# Patient Record
Sex: Female | Born: 1965 | Race: White | Hispanic: No | Marital: Married | Smoking: Former smoker
Health system: Southern US, Community
[De-identification: ages and names within clinical notes are randomized; demographics above are authoritative.]

## PROBLEM LIST (undated history)

## (undated) DIAGNOSIS — G459 Transient cerebral ischemic attack, unspecified: Secondary | ICD-10-CM

## (undated) DIAGNOSIS — M545 Low back pain, unspecified: Secondary | ICD-10-CM

## (undated) DIAGNOSIS — E559 Vitamin D deficiency, unspecified: Secondary | ICD-10-CM

## (undated) DIAGNOSIS — Z8489 Family history of other specified conditions: Secondary | ICD-10-CM

## (undated) DIAGNOSIS — E785 Hyperlipidemia, unspecified: Secondary | ICD-10-CM

## (undated) DIAGNOSIS — E7212 Methylenetetrahydrofolate reductase deficiency: Secondary | ICD-10-CM

## (undated) DIAGNOSIS — A6920 Lyme disease, unspecified: Secondary | ICD-10-CM

## (undated) DIAGNOSIS — E7211 Homocystinuria: Secondary | ICD-10-CM

## (undated) DIAGNOSIS — R601 Generalized edema: Secondary | ICD-10-CM

## (undated) DIAGNOSIS — R760 Raised antibody titer: Secondary | ICD-10-CM

## (undated) DIAGNOSIS — E282 Polycystic ovarian syndrome: Secondary | ICD-10-CM

## (undated) DIAGNOSIS — E039 Hypothyroidism, unspecified: Secondary | ICD-10-CM

## (undated) DIAGNOSIS — M1990 Unspecified osteoarthritis, unspecified site: Secondary | ICD-10-CM

## (undated) HISTORY — DX: Low back pain: M54.5

## (undated) HISTORY — DX: Low back pain, unspecified: M54.50

## (undated) HISTORY — DX: Polycystic ovarian syndrome: E28.2

## (undated) HISTORY — DX: Generalized edema: R60.1

## (undated) HISTORY — DX: Lyme disease, unspecified: A69.20

## (undated) HISTORY — DX: Hypothyroidism, unspecified: E03.9

## (undated) HISTORY — DX: Transient cerebral ischemic attack, unspecified: G45.9

## (undated) HISTORY — DX: Hyperlipidemia, unspecified: E78.5

## (undated) HISTORY — DX: Unspecified osteoarthritis, unspecified site: M19.90

## (undated) HISTORY — DX: Homocystinuria: E72.11

## (undated) HISTORY — DX: Vitamin D deficiency, unspecified: E55.9

## (undated) HISTORY — DX: Methylenetetrahydrofolate reductase deficiency: E72.12

## (undated) HISTORY — DX: Raised antibody titer: R76.0

## (undated) HISTORY — PX: OTHER SURGICAL HISTORY: SHX169

---

## 1999-10-13 ENCOUNTER — Other Ambulatory Visit: Admission: RE | Admit: 1999-10-13 | Discharge: 1999-10-13 | Payer: Self-pay | Admitting: *Deleted

## 2000-04-15 ENCOUNTER — Inpatient Hospital Stay (HOSPITAL_COMMUNITY): Admission: AD | Admit: 2000-04-15 | Discharge: 2000-04-15 | Payer: Self-pay | Admitting: Obstetrics and Gynecology

## 2000-06-19 ENCOUNTER — Inpatient Hospital Stay (HOSPITAL_COMMUNITY): Admission: AD | Admit: 2000-06-19 | Discharge: 2000-06-23 | Payer: Self-pay | Admitting: *Deleted

## 2000-08-21 ENCOUNTER — Other Ambulatory Visit: Admission: RE | Admit: 2000-08-21 | Discharge: 2000-08-21 | Payer: Self-pay | Admitting: Obstetrics & Gynecology

## 2001-08-22 ENCOUNTER — Other Ambulatory Visit: Admission: RE | Admit: 2001-08-22 | Discharge: 2001-08-22 | Payer: Self-pay | Admitting: Obstetrics & Gynecology

## 2002-10-03 ENCOUNTER — Other Ambulatory Visit: Admission: RE | Admit: 2002-10-03 | Discharge: 2002-10-03 | Payer: Self-pay | Admitting: Obstetrics and Gynecology

## 2004-05-05 ENCOUNTER — Other Ambulatory Visit: Admission: RE | Admit: 2004-05-05 | Discharge: 2004-05-05 | Payer: Self-pay | Admitting: Obstetrics & Gynecology

## 2005-12-11 ENCOUNTER — Encounter: Admission: RE | Admit: 2005-12-11 | Discharge: 2005-12-11 | Payer: Self-pay | Admitting: Orthopedic Surgery

## 2006-01-01 ENCOUNTER — Encounter: Admission: RE | Admit: 2006-01-01 | Discharge: 2006-01-01 | Payer: Self-pay | Admitting: Orthopedic Surgery

## 2006-04-12 ENCOUNTER — Encounter: Admission: RE | Admit: 2006-04-12 | Discharge: 2006-04-12 | Payer: Self-pay | Admitting: Obstetrics and Gynecology

## 2006-08-23 ENCOUNTER — Encounter: Admission: RE | Admit: 2006-08-23 | Discharge: 2006-08-23 | Payer: Self-pay | Admitting: Orthopedic Surgery

## 2006-09-12 ENCOUNTER — Encounter: Admission: RE | Admit: 2006-09-12 | Discharge: 2006-09-12 | Payer: Self-pay | Admitting: Orthopedic Surgery

## 2007-02-08 ENCOUNTER — Encounter: Admission: RE | Admit: 2007-02-08 | Discharge: 2007-02-08 | Payer: Self-pay | Admitting: Orthopedic Surgery

## 2007-11-15 ENCOUNTER — Encounter: Admission: RE | Admit: 2007-11-15 | Discharge: 2007-11-15 | Payer: Self-pay | Admitting: Obstetrics and Gynecology

## 2008-08-04 ENCOUNTER — Encounter: Admission: RE | Admit: 2008-08-04 | Discharge: 2008-08-04 | Payer: Self-pay | Admitting: Obstetrics & Gynecology

## 2008-09-15 ENCOUNTER — Encounter: Admission: RE | Admit: 2008-09-15 | Discharge: 2008-09-15 | Payer: Self-pay | Admitting: Obstetrics & Gynecology

## 2009-12-09 ENCOUNTER — Encounter: Admission: RE | Admit: 2009-12-09 | Discharge: 2009-12-09 | Payer: Self-pay | Admitting: Obstetrics & Gynecology

## 2010-01-13 ENCOUNTER — Encounter
Admission: RE | Admit: 2010-01-13 | Discharge: 2010-01-13 | Payer: Self-pay | Source: Home / Self Care | Admitting: Obstetrics & Gynecology

## 2010-05-08 DIAGNOSIS — G459 Transient cerebral ischemic attack, unspecified: Secondary | ICD-10-CM

## 2010-05-08 HISTORY — DX: Transient cerebral ischemic attack, unspecified: G45.9

## 2010-06-05 ENCOUNTER — Emergency Department (HOSPITAL_COMMUNITY)
Admission: EM | Admit: 2010-06-05 | Discharge: 2010-06-06 | Disposition: A | Discharge status: Home / Self Care | Payer: 59 | Attending: Emergency Medicine | Admitting: Emergency Medicine

## 2010-06-05 DIAGNOSIS — E039 Hypothyroidism, unspecified: Secondary | ICD-10-CM | POA: Insufficient documentation

## 2010-06-05 DIAGNOSIS — R319 Hematuria, unspecified: Secondary | ICD-10-CM | POA: Insufficient documentation

## 2010-06-05 DIAGNOSIS — R109 Unspecified abdominal pain: Secondary | ICD-10-CM | POA: Insufficient documentation

## 2010-06-05 LAB — URINALYSIS, ROUTINE W REFLEX MICROSCOPIC
Ketones, ur: NEGATIVE mg/dL
Protein, ur: NEGATIVE mg/dL
Urobilinogen, UA: 0.2 mg/dL (ref 0.0–1.0)

## 2010-06-05 LAB — CBC
HCT: 42.5 % (ref 36.0–46.0)
MCH: 30.3 pg (ref 26.0–34.0)
MCV: 89.3 fL (ref 78.0–100.0)
Platelets: 328 10*3/uL (ref 150–400)
RBC: 4.76 MIL/uL (ref 3.87–5.11)

## 2010-06-05 LAB — DIFFERENTIAL
Eosinophils Absolute: 0.4 10*3/uL (ref 0.0–0.7)
Eosinophils Relative: 3 % (ref 0–5)
Lymphocytes Relative: 11 % — ABNORMAL LOW (ref 12–46)
Lymphs Abs: 1.5 10*3/uL (ref 0.7–4.0)
Monocytes Relative: 8 % (ref 3–12)
Neutrophils Relative %: 78 % — ABNORMAL HIGH (ref 43–77)

## 2010-06-05 LAB — URINE MICROSCOPIC-ADD ON

## 2010-06-06 LAB — BASIC METABOLIC PANEL
BUN: 12 mg/dL (ref 6–23)
Chloride: 107 mEq/L (ref 96–112)
Creatinine, Ser: 0.69 mg/dL (ref 0.4–1.2)
Glucose, Bld: 100 mg/dL — ABNORMAL HIGH (ref 70–99)
Potassium: 3.9 mEq/L (ref 3.5–5.1)

## 2010-06-06 LAB — CK: Total CK: 88 U/L (ref 7–177)

## 2010-06-16 ENCOUNTER — Other Ambulatory Visit: Payer: Self-pay | Admitting: Nurse Practitioner

## 2010-06-16 DIAGNOSIS — R52 Pain, unspecified: Secondary | ICD-10-CM

## 2010-06-16 DIAGNOSIS — D6861 Antiphospholipid syndrome: Secondary | ICD-10-CM

## 2010-06-17 ENCOUNTER — Ambulatory Visit
Admission: RE | Admit: 2010-06-17 | Discharge: 2010-06-17 | Disposition: A | Discharge status: Home / Self Care | Payer: 59 | Source: Ambulatory Visit | Attending: Nurse Practitioner | Admitting: Nurse Practitioner

## 2010-06-17 DIAGNOSIS — R52 Pain, unspecified: Secondary | ICD-10-CM

## 2010-06-17 DIAGNOSIS — D6861 Antiphospholipid syndrome: Secondary | ICD-10-CM

## 2010-06-24 NOTE — Discharge Summary (Signed)
[facility]  Patient:    Sherri Mullins, Sherri Mullins                         MRN: [id number] Adm. Date:  [id number] Disc. Date: [id number] Attending:  Donne Hazel Dictator:   Danie Chandler, R.N.                           Discharge Summary  ADMISSION DIAGNOSIS:          Intrauterine pregnancy for post dates induction.  DISCHARGE DIAGNOSES:          1. Intrauterine pregnancy for post dates                                  induction.                               2. Cephalopelvic disproportion with failed                                  attempt to deliver infant vaginally by                                  vacuum extractor.  PROCEDURE:                    Jun 20, 2000, primary low transverse cervical cesarean section.  REASON FOR ADMISSION:         The patient was admitted for induction on the evening prior to the surgery and was induced with Cytotec intravaginally.  The patient progressed through the night to 8 cm of dilatation by approximately 9:30 a.m.  The later part of the second stage was a little slow with progression to complete by approximately 12 noon.  The patient then pushed for about an hour and a half without significant descent of the head. The head was found to be a left occiput transverse position. Vacuum extractor was applied with good application and attempted delivery with a total of six contractions. Descent could be achieved to a level of approximately +3 to +4 but the head would not rotate and it was felt that the mid pelvis was compromised and total that the size of the infant was large and for that reason, attempts to deliver vaginally were abandoned.  HOSPITAL COURSE:              The patient was taken to the operating room and underwent the above named procedure without complications.  This was productive of a viable female infant with Apgars of 8 at one minute and 9 at five minutes. Postoperatively on day #1, the patients  hemoglobin was 11.2, hematocrit 32.7 and white blood cell count 16.8.  The patient had good control of pain on this day and on postoperative day #2, she had a good return of bowel function and was tolerating a regular diet. She was also ambulating well without difficulty. She was discharged home on postoperative day #3.  CONDITION ON DISCHARGE:       Good.  DIET:  Regular as tolerated.  ACTIVITY:                     No heavy lifting, no driving, no vaginal entry.  FOLLOW-UP:                    She is to follow up in the office in one to two weeks for an incision check and she is to call for temperature greater than 100 degrees, persistent nausea or vomiting, heavy vaginal bleeding, and/or redness or drainage from the incision site.  DISCHARGE MEDICATIONS:        1. Prenatal vitamin one p.o. q.d.                               2. Demerol 50 mg #40 as directed by M.D. D:  07/05/00 TD:  07/05/00 Job: [id number]

## 2010-06-24 NOTE — Op Note (Signed)
[facility]  Patient:    Sherri Mullins, Sherri Mullins                         MRN: [id number] Proc. Date: 06/20/00 Adm. Date:  [id number] Attending:  Donne Hazel                           Operative Report  PREOPERATIVE DIAGNOSES:       Intrauterine pregnancy, at [redacted] weeks gestation. Cephalopelvic disproportion with failed attempt to deliver infant vaginally by vacuum extractor.  POSTOPERATIVE DIAGNOSES:      Intrauterine pregnancy, at [redacted] weeks gestation. Cephalopelvic disproportion with failed attempt to deliver infant vaginally by vacuum extractor.  OPERATIVE PROCEDURE:         Primary low transverse cervical cesarean section.  SURGEON:                      Minette Headland, M.D.  ANESTHESIA:                   Epidural.  INTRAOPERATIVE COMPLICATIONS:                None.  ESTIMATED INTRAOPERATIVE BLOOD LOSS:                   600-800 cc.  INDICATIONS:                  The patient was admitted for induction on the evening prior to the surgery and was induced with Cytotec intravaginally.  She progressed through the night to 8 cm of dilatation by approximately 9:30 a.m. The later part of second stage was a little slow with progression to complete by approximately 12 noon.  She then pushed for an hour and a half without significant decent of the head.  The head was found to be in the left occiput transverse position.  Vacuum extractor was applied with good application and attempted delivery with a total of six contractions.  Descent could be achieved to a level of approximately +3 to +4 but the head would not rotate and it was felt that the mid pelvis was compromised and that the size of the infant was large, and for that reason, attempts to deliver vaginally were abandoned.  DESCRIPTION OF PROCEDURE:     The patient was then brought to the operating room.  Her epidural was re-dosed for surgery.  She did have a hot spot and required local to the  skin for the skin incision.  A lower abdominal transverse incision was made and carried sharply down to fascia which was entered sharply and extended to the extent of skin incision.  The rectus sheath was developed superiorly and inferiorly with blunt and sharp dissection.  Rectus muscles were divided in the midline. The peritoneum was entered sharply, and extended sharply to the extent of the skin incision.  A bladder blade was placed.  A transverse incision was made.  The visceral peritoneum overlying the lower uterine segment and the bladder bluntly dissected off the lower segment.  A transverse incision was made in the lower segment.  The infants head was elevated out of pelvis.  The infant was then delivered without significant difficulty.  It was a viable female with Apgars of 8 and 9 as noted above.  Arterial cord blood was obtained for gases and is pending.  The placenta and other products of  conception were removed from the uterus after collection of routine venous cord sample.  Uterine incision was closed with a running locking suture of 0 Monocryl.  The bladder flap was reapproximated with an interrupted figure-of-eight of 0 Monocryl. Irrigation was carried out.  Hemostasis was adequate.  Abdominal incision was then closed in layers, running 0 Monocryl was used to close the peritoneum and reapproximated the rectus muscles.  The fascia was closed with running 0 Panacryl.  The skin was closed with wide skin staples and 0.25 Steri-Strips. The patient tolerated the procedure well and was taken to the recovery room in good condition. DD:  06/20/00 TD:  06/20/00 Job: [id number]

## 2011-10-26 ENCOUNTER — Telehealth: Payer: Self-pay | Admitting: Oncology

## 2011-10-26 NOTE — Telephone Encounter (Signed)
S/W in re NP appt 10/02 @ 10:30 w/ Dr. Gaylyn Rong. Referring Dr. 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 NP packet mailed

## 2011-10-27 ENCOUNTER — Encounter: Payer: Self-pay | Admitting: Oncology

## 2011-10-27 ENCOUNTER — Telehealth: Payer: Self-pay | Admitting: Oncology

## 2011-10-27 DIAGNOSIS — R760 Raised antibody titer: Secondary | ICD-10-CM | POA: Insufficient documentation

## 2011-10-27 NOTE — Telephone Encounter (Signed)
C/D on 9/20 for appt 10/02

## 2011-10-31 ENCOUNTER — Other Ambulatory Visit: Payer: Self-pay | Admitting: Obstetrics & Gynecology

## 2011-10-31 DIAGNOSIS — N63 Unspecified lump in unspecified breast: Secondary | ICD-10-CM

## 2011-11-08 ENCOUNTER — Other Ambulatory Visit (HOSPITAL_BASED_OUTPATIENT_CLINIC_OR_DEPARTMENT_OTHER): Payer: BC Managed Care – PPO | Admitting: Lab

## 2011-11-08 ENCOUNTER — Ambulatory Visit: Payer: BC Managed Care – PPO

## 2011-11-08 ENCOUNTER — Encounter: Payer: Self-pay | Admitting: Oncology

## 2011-11-08 ENCOUNTER — Ambulatory Visit (HOSPITAL_BASED_OUTPATIENT_CLINIC_OR_DEPARTMENT_OTHER): Payer: BC Managed Care – PPO | Admitting: Oncology

## 2011-11-08 VITALS — BP 131/81 | HR 90 | Temp 97.4°F | Resp 20 | Ht 64.0 in | Wt 211.3 lb

## 2011-11-08 DIAGNOSIS — R0609 Other forms of dyspnea: Secondary | ICD-10-CM

## 2011-11-08 DIAGNOSIS — R894 Abnormal immunological findings in specimens from other organs, systems and tissues: Secondary | ICD-10-CM

## 2011-11-08 DIAGNOSIS — R760 Raised antibody titer: Secondary | ICD-10-CM

## 2011-11-08 DIAGNOSIS — R0989 Other specified symptoms and signs involving the circulatory and respiratory systems: Secondary | ICD-10-CM

## 2011-11-08 DIAGNOSIS — E7210 Disorders of sulfur-bearing amino-acid metabolism, unspecified: Secondary | ICD-10-CM

## 2011-11-08 NOTE — Progress Notes (Signed)
Checked in new pt with no financial concerns. °

## 2011-11-08 NOTE — Progress Notes (Signed)
[facility]  [phone] [phone]     INITIAL HEMATOLOGY CONSULTATION    Referral Provider:  Elie Goody, N.P.   Reason for Referral: positive phospholipid antibody.     HPI:  Sherri Mullins is a 46 year old woman.  She has been feeling tired the last few years.  She was told in the past that she had obesity.  She was not satisfied with this explanation.  She has been under medical care of Sherri Mullins, N.P.  A pan work up was sent which showed positive phospholipid antibody with antiphosphotidylserine elevated at 50.  She was tested negative for lupus.  Thus, she was kindly referred to the [facility] for evaluation.   Sherri Mullins presented to the Clinic today for the first time by herself.  She never had documented episodes of thrombosis.  She had an episode in 05/2010 when she was under lots of stress at work, and had problem with words findings.  She thought that she had a TIA event.  However, she never went to the hospital to have this checked out.  She also has been having intermittent exertional chest pressure, SOB, dyspnea on exertion episodes.  Since she has been with Elie Goody, she has been found to have hypothyroid and has been on Synthroid.  She was found to have MTHFR gene mutation and was prescribed a formulated folate.  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  She has been on Aldactone and Lasix not for hypertension but in patient's opinion was to "cleansed her system of toxins."     Since being on folate and Synthroid, her fatigue has slightly improved. She also has been having intermittent exertional chest pressure, SOB, dyspnea on exertion episodes. She never had known documented PE or MI.    Patient denies fever, anorexia, weight loss, headache, visual changes, confusion, drenching night sweats, palpable lymph node swelling, mucositis, odynophagia, dysphagia, nausea vomiting, jaundice, productive cough, gum  bleeding, epistaxis, hematemesis, hemoptysis, abdominal pain, abdominal swelling, early satiety, melena, hematochezia, hematuria, skin rash, spontaneous bleeding, joint swelling, heat or cold intolerance, bowel bladder incontinence, focal motor weakness, paresthesia.  As mentioned earlier, she does not have skin rash.  However, when she raises her arms, she noticed significant pallor color to her arms.  She denied any paresthesia, neck pain muscle weakness in her bilateral arms.      Past Medical History  Diagnosis Date  . Antiphospholipid antibody positive   . Hyperlipidemia   . TIA (transient ischemic attack) 05/2010    ?  work; pressure; aphasia.  No documented stroke.   Marland Kitchen Hypothyroid   . Generalized edema   . Vitamin d deficiency   . Lumbar back pain     with steroid injection  (in 2009  :    Past Surgical History  Procedure Date  . Cesarean section 2002  . Epidural steroid injection   :   CURRENT MEDS: Current Outpatient Prescriptions  Medication Sig Dispense Refill  . 5-Methyltetrahydrofolate Disod POWD by Does not apply route. Use as directed      . ARMOUR THYROID 120 MG tablet Take 120 mg by mouth Daily.      . Calcium Carb-Cholecalciferol (LIQUID CALCIUM WITH D3 PO) Take by mouth. Use as directed      . cholestyramine (QUESTRAN) 4 G packet As directed      . furosemide (LASIX) 20 MG tablet Take 20 mg by mouth Daily.      Marland Kitchen  ketoconazole (NIZORAL) 2 % shampoo As directed      . liothyronine (CYTOMEL) 5 MCG tablet Take 5 mcg by mouth Daily.      . naltrexone (DEPADE) 50 MG tablet Take 50 mg by mouth daily.      Marland Kitchen oxymetazoline (AFRIN) 0.05 % nasal spray Place 2 sprays into the nose 2 (two) times daily.      . potassium chloride (K-DUR,KLOR-CON) 10 MEQ tablet Take 10 mEq by mouth daily.      . progesterone (PROMETRIUM) 100 MG capsule Take by mouth daily. As directed      . spironolactone (ALDACTONE) 50 MG tablet Take 50 mg by mouth Daily.          Allergies    Allergen Reactions  . Nitrous Oxide   . Percocet (Oxycodone-Acetaminophen) Itching  . Percodan (Oxycodone-Aspirin) Itching  :  Family History  Problem Relation Age of Onset  . Cancer Maternal Aunt 60    breast   . Cancer Maternal Grandmother 60    breast  . Stroke Maternal Grandfather   :  History   Social History  . Marital Status: Married    Spouse Name: N/A    Number of Children: 1  . Years of Education: N/A   Occupational History  . 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 Smoking status: Former Smoker -- 1.0 packs/day for 12 years    Quit date: 02/06/1998  . Smokeless tobacco: Never Used  . Alcohol Use: No  . Drug Use: No  . Sexually Active:    Other Topics Concern  . Not on file   Social History Narrative  . No narrative on file  :  REVIEW OF SYSTEM:  The rest of the 14-point review of sytem was negative.   Exam: ECOG 0  General:  well-nourished woman, in no acute distress.  Eyes:  no scleral icterus.  ENT:  There were no oropharyngeal lesions.  Neck was without thyromegaly.  Lymphatics:  Negative cervical, supraclavicular or axillary adenopathy.  Respiratory: lungs were clear bilaterally without wheezing or crackles.  Cardiovascular:  Regular rate and rhythm, S1/S2, without murmur, rub or gallop. There was no pedal edema.  GI:  abdomen was soft, flat, nontender, nondistended, without organomegaly.  Muscoloskeletal:  no spinal tenderness of palpation of vertebral spine.  Skin exam was without echymosis, petichae.  Neuro exam was nonfocal.  Patient was able to get on and off exam table without assistance.  Gait was normal.  Patient was alerted and oriented.  Attention was good.   Language was appropriate.  Mood was normal without depression.  Speech was not pressured.  Thought content was not tangential.    LABS:  Lab Results  Component Value Date   WBC 13.6* 06/05/2010   HGB 14.4 06/05/2010   HCT 42.5 06/05/2010   PLT 328 06/05/2010    GLUCOSE 100* 06/05/2010   NA 140 06/05/2010   K 3.9 06/05/2010   CL 107 06/05/2010   CREATININE 0.69 06/05/2010   BUN 12 06/05/2010   CO2 27 06/05/2010     ASSESSMENT AND PLAN:   1.  Positive antiphospholipid antibody.  -Interpretation:  This test is very nonspecific; and high rate of false positive.  In other words, many patients have this antibody without having the syndrome (antilupus antibody syndrome or antiphospholipid antibody syndrome).  -  Recommendation:  Repeat this test again today for documentation that it is truly elevated.  Without documented blot clot, I do not recommend lifelong anticoagulation since the risk of bleeding outweighs the benefit of decreasing blood clot.  Her one episode of aphasia could be due to stress; and she never had documented CVA, TIA.  Her DOE symptoms could be more cardiac in origin.  She never had documented DVT, PE. - However, if patients with persistently positive antiphospholipid antibody and have a documented blood clot, then, recommendation would be life long blood thinner.   2.  MTHFR mutation:  The risk of thrombosis with this mutation is not significantly elevated like other thrombophilia such as factor V leyden, antithrombin III deficiency.  I defer to patient's PCP's expertise as to whether folate is indicated.  But from hematology standpoint, folate is not necessary.   3.  Dyspnea on exertion:  I strongly advised patient to discuss with her PCP to see whether a cardiac referral is indicated at least for noninvasive, exercise stress test to rule out cardiac cause of this symptom.  I have low clinical suspicion for pulmonary/hem causes of her DOE.   4.  Follow up:  As needed.  No return appointment at this time.    Thank you for this referral.    The length of time of the face-to-face encounter was 30 minutes. More than 50% of time was spent counseling and coordination of care.

## 2011-11-08 NOTE — Patient Instructions (Addendum)
1.  Issue:  Positive antiphospholipid antibody.  2.  Interpretation:  This test is very nonspecific; and high rate of false positive.  In other words, many patients have this antibody without having the syndrome (antilupus antibody syndrome or antiphospholipid antibody syndrome).  3.  Recommendation:  Repeat this test again for documentation that it is truly elevated.   Without documented blot clot, I do not recommend lifelong anticoagulation since the risk of bleeding outweighs the benefit of decreasing blood clot.  However, if patients with persistently positive antiphospholipid antibody and have a documented blood clot, then, recommendation would be life long blood thinner.  4.  Follow up:  As needed.  No return appointment at this time.

## 2011-11-09 ENCOUNTER — Ambulatory Visit
Admission: RE | Admit: 2011-11-09 | Discharge: 2011-11-09 | Disposition: A | Discharge status: Home / Self Care | Payer: BC Managed Care – PPO | Source: Ambulatory Visit | Attending: Obstetrics & Gynecology | Admitting: Obstetrics & Gynecology

## 2011-11-09 ENCOUNTER — Encounter: Payer: Self-pay | Admitting: Oncology

## 2011-11-09 ENCOUNTER — Other Ambulatory Visit: Payer: Self-pay | Admitting: Obstetrics & Gynecology

## 2011-11-09 DIAGNOSIS — N63 Unspecified lump in unspecified breast: Secondary | ICD-10-CM

## 2011-11-09 LAB — BETA-2 GLYCOPROTEIN ANTIBODIES: Beta-2 Glyco I IgG: 0 G Units (ref <20)

## 2011-11-09 LAB — LUPUS ANTICOAGULANT PANEL: DRVVT: 40.3 secs (ref <42.9)

## 2012-02-22 IMAGING — US US EXTREM LOW VENOUS BILAT
1 series · 14 of 24 positions shown · non-contrast
Comparison: None

CLINICAL DATA: Pain//Antiphospholipid syndrome//? DVT;;

BILATERAL LOWER EXTREMITY VENOUS DUPLEX ULTRASOUND
TECHNIQUE: Gray-scale sonography with compression, as well as color
and duplex ultrasound, were performed to evaluate the deep venous
system from the level of the common femoral vein through the
popliteal and proximal calf veins.

[Series 1: us extrem low venous bilat · 14 of 57 slices shown]
[im 1/57]
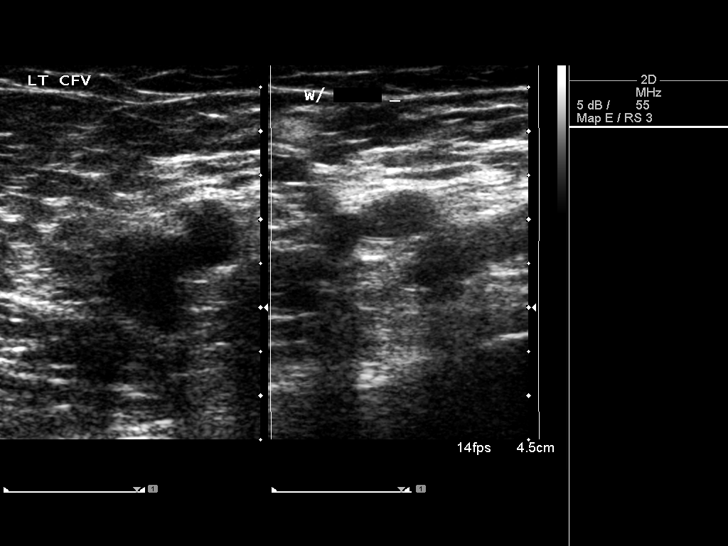
[im 5/57]
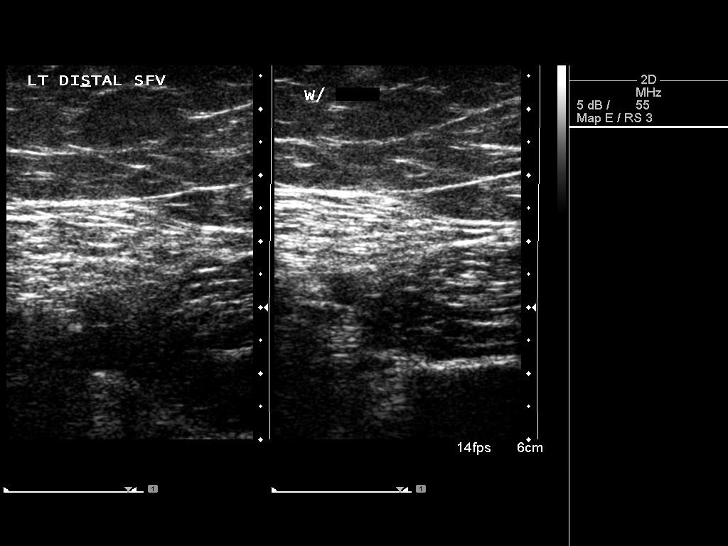
[im 10/57]
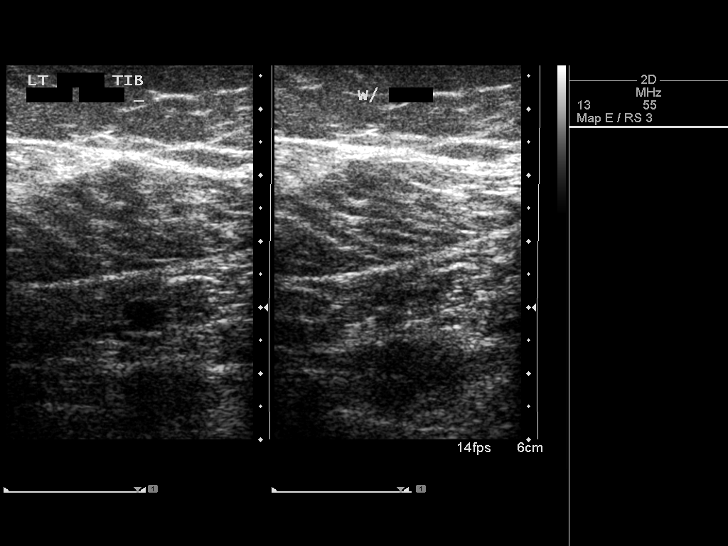
[im 15/57]
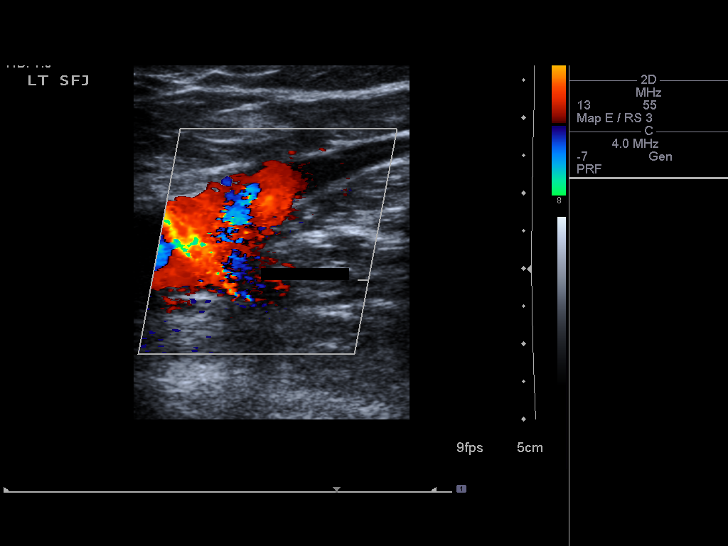
[im 18/57]
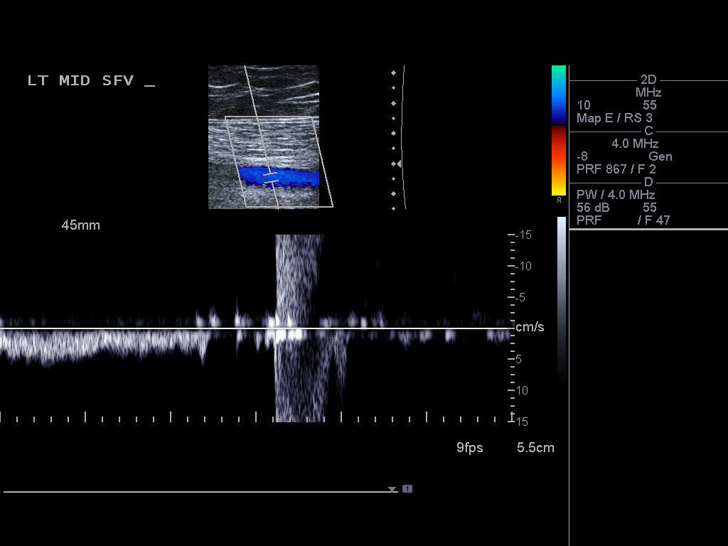
[im 22/57]
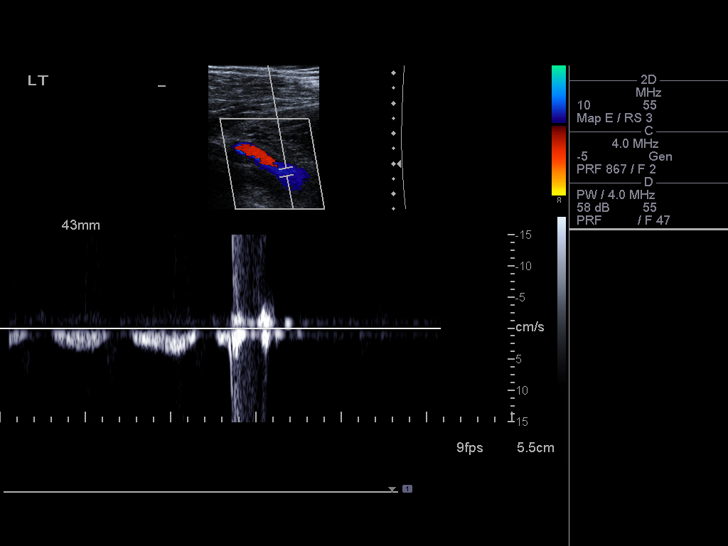
[im 27/57]
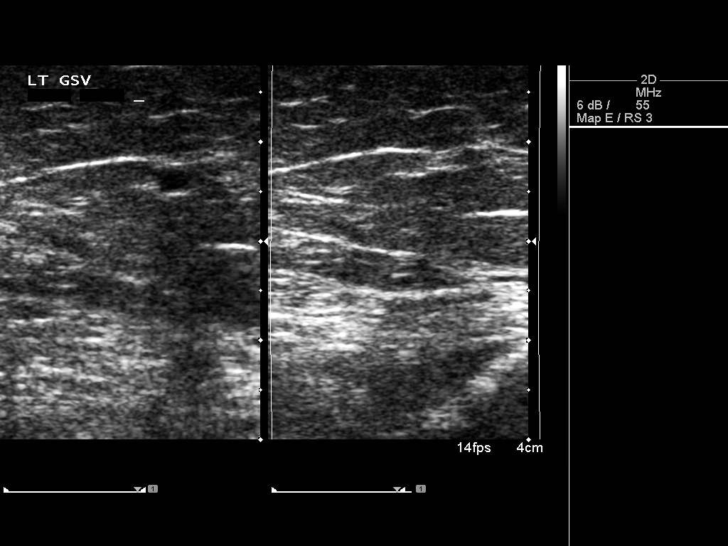
[im 30/57]
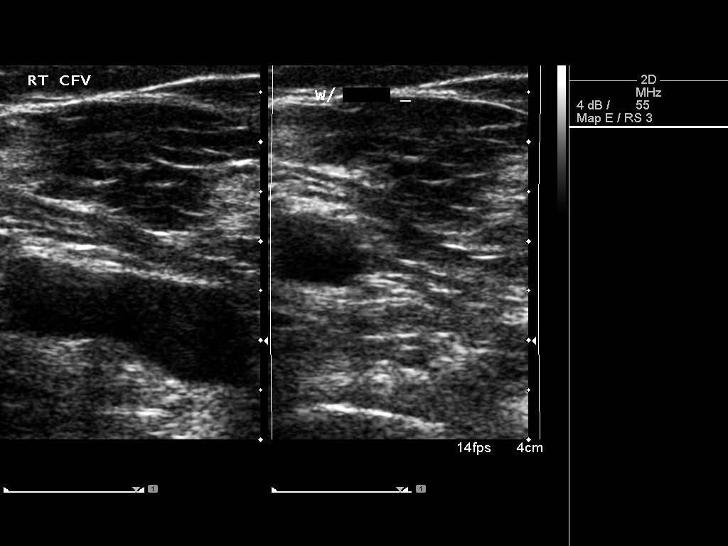
[im 35/57]
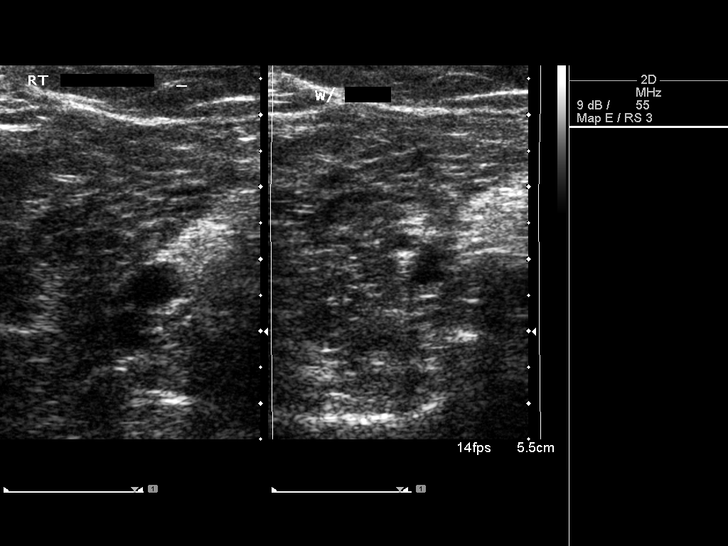
[im 39/57]
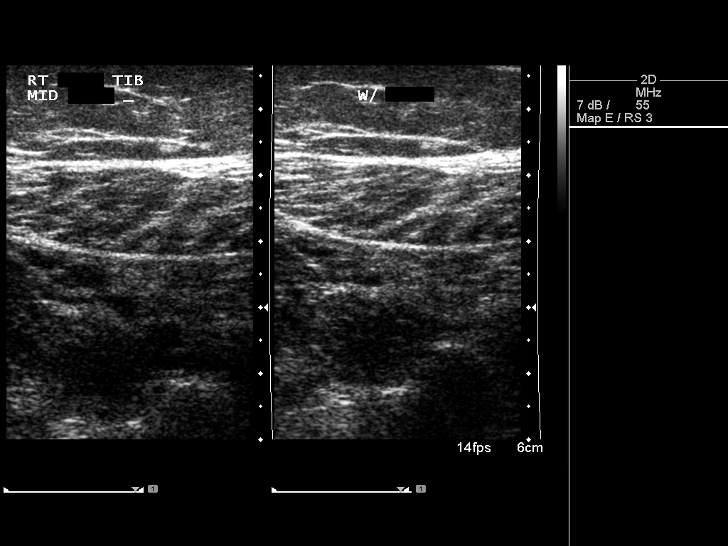
[im 44/57]
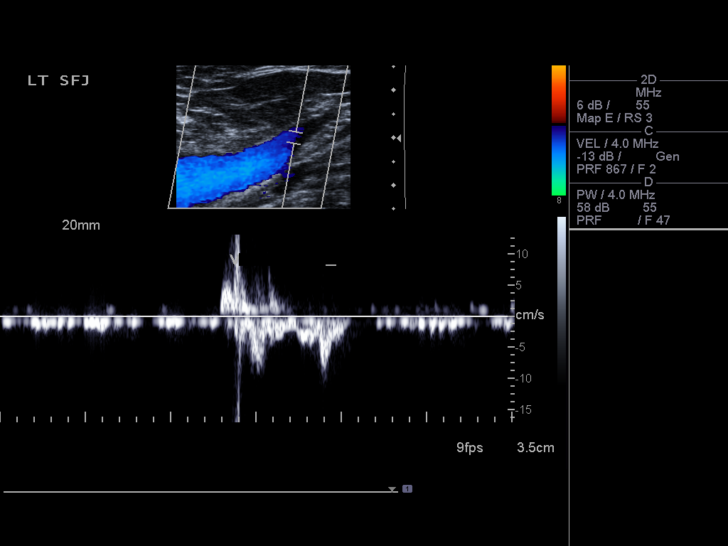
[im 47/57]
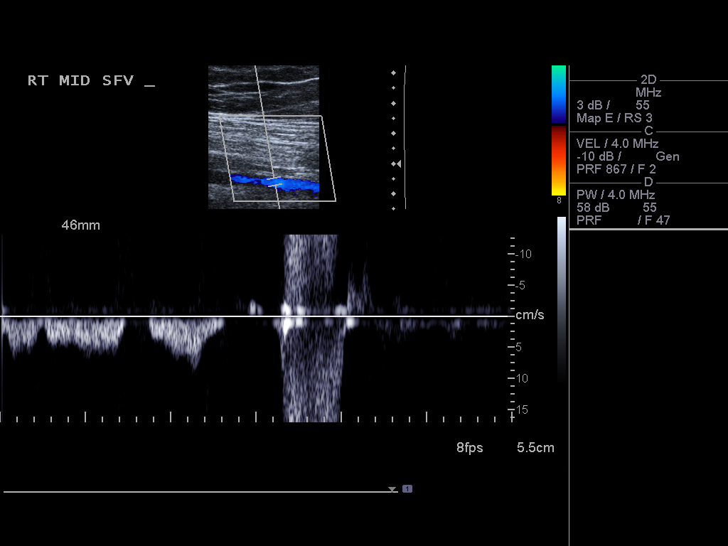
[im 52/57]
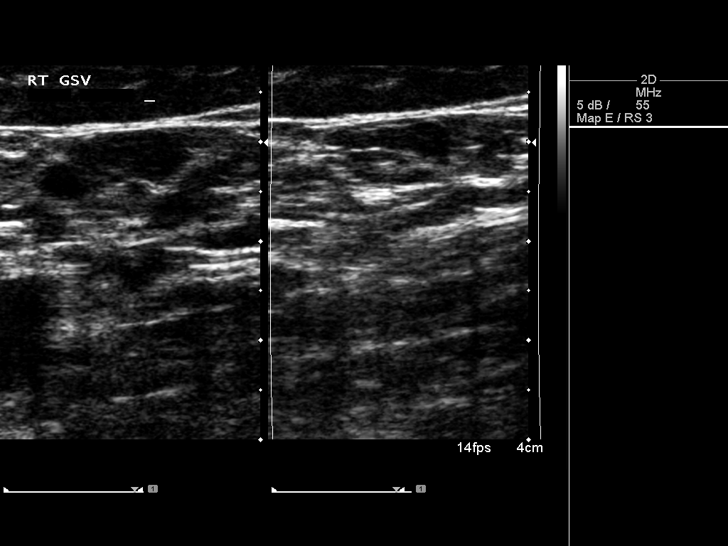
[im 57/57]
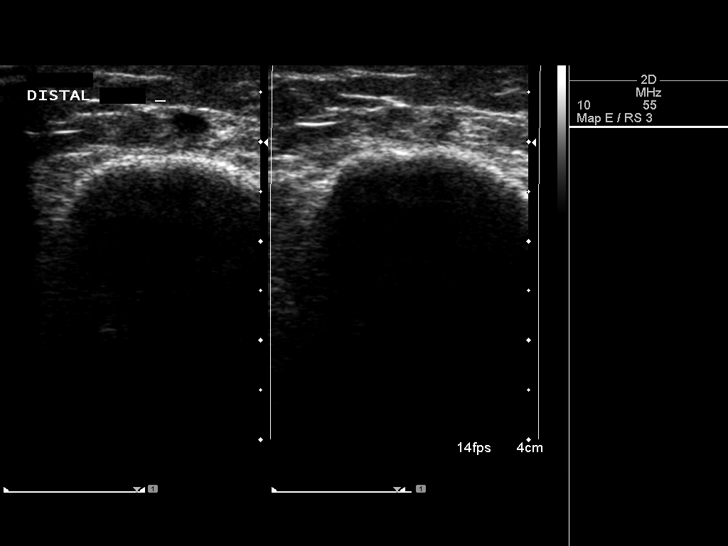

[14 of 24 positions shown; findings below may reference images not displayed]

FINDINGS: Normal compressibility and normal Doppler signal within
the common femoral, superficial femoral and popliteal veins, down
to the proximal calf veins.  No grayscale filling defects to
suggest DVT.
IMPRESSION: No evidence of bilateral lower extremity deep vein thrombosis.

## 2013-12-04 ENCOUNTER — Other Ambulatory Visit: Payer: Self-pay | Admitting: Obstetrics and Gynecology

## 2013-12-04 DIAGNOSIS — N644 Mastodynia: Secondary | ICD-10-CM

## 2013-12-17 ENCOUNTER — Ambulatory Visit
Admission: RE | Admit: 2013-12-17 | Discharge: 2013-12-17 | Disposition: A | Discharge status: Home / Self Care | Payer: BC Managed Care – PPO | Source: Ambulatory Visit | Attending: Obstetrics and Gynecology | Admitting: Obstetrics and Gynecology

## 2013-12-17 DIAGNOSIS — N644 Mastodynia: Secondary | ICD-10-CM

## 2014-11-20 ENCOUNTER — Telehealth: Payer: Self-pay

## 2014-11-20 ENCOUNTER — Ambulatory Visit (INDEPENDENT_AMBULATORY_CARE_PROVIDER_SITE_OTHER): Payer: BC Managed Care – PPO | Admitting: Physician Assistant

## 2014-11-20 VITALS — BP 108/72 | HR 86 | Temp 98.3°F | Resp 18 | Ht 66.5 in | Wt 215.0 lb

## 2014-11-20 DIAGNOSIS — E063 Autoimmune thyroiditis: Secondary | ICD-10-CM | POA: Insufficient documentation

## 2014-11-20 DIAGNOSIS — Z6834 Body mass index (BMI) 34.0-34.9, adult: Secondary | ICD-10-CM | POA: Insufficient documentation

## 2014-11-20 DIAGNOSIS — E038 Other specified hypothyroidism: Secondary | ICD-10-CM | POA: Insufficient documentation

## 2014-11-20 DIAGNOSIS — N959 Unspecified menopausal and perimenopausal disorder: Secondary | ICD-10-CM | POA: Insufficient documentation

## 2014-11-20 DIAGNOSIS — R309 Painful micturition, unspecified: Secondary | ICD-10-CM | POA: Diagnosis not present

## 2014-11-20 LAB — POCT URINALYSIS DIP (MANUAL ENTRY)
Glucose, UA: NEGATIVE
Nitrite, UA: POSITIVE — AB
Spec Grav, UA: 1.025
UROBILINOGEN UA: 1
pH, UA: 7

## 2014-11-20 LAB — POC MICROSCOPIC URINALYSIS (UMFC): Mucus: ABSENT

## 2014-11-20 MED ORDER — SULFAMETHOXAZOLE-TRIMETHOPRIM 800-160 MG PO TABS: 1.0000 | ORAL_TABLET | Freq: Two times a day (BID) | ORAL | Status: DC

## 2014-11-20 NOTE — Telephone Encounter (Signed)
Spoke with Sherri Mullins, the patient's husband. Began to worsen upon arriving home. Concerned that she may have a kidney stone. Developed nausea, vomited. Pain moving from the front to the back, so severe that she said "I hope it's not something worse than a kidney stone." Took a leftover dose of hydrocodone, which helped some, but it didn't last very long, though she is sleeping now.   Patient awoke while Sherri Mullins and I were talking, and I spoke with her, as well. Advised to increase oral hydration. Can take the hydrocodone she has left, 10-325, 1 every 4 hours. May supplement with ibuprofen 600 mg QID. If worsens over night, advised to go to the ED. Can contact me tomorrow if needed, though she is allergic to oxycodone, so would likely need to go to the ED if additional pain relief is needed.

## 2014-11-20 NOTE — Patient Instructions (Signed)
Get plenty of rest and drink at least 64 ounces of water daily.  I will contact you with your lab results as soon as they are available.   If you have not heard from me in 2 weeks, please contact me.  The fastest way to get your results is to register for My Chart (see the instructions on the last page of this printout).   

## 2014-11-20 NOTE — Progress Notes (Signed)
Subjective:   Patient ID: Sherri Mullins, female     DOB: [birth year], 49 y.o.    MRN: [id number]  PCP: No primary care provider on file.  Chief Complaint  Patient presents with  . Dysuria    with urge x3 days   . Abdominal Pain    HPI  Presents for evaluation of suspected UTI.  Pain with urination. Increased frequency, but then small urine volume. No noted hematuria, but taking an OTC product that turns the urine dark, so it's hard to tell. History of kidney stone. The pressure and urgency feels similar. Increased frequency of sex recently with her husband. Some nausea for the past couple of days, and feeling HA at the base of her neck, and vertigo in episodes. No fever or chills. No atypical vaginal discharge.   Prior to Admission medications   Medication Sig Start Date End Date Taking? Authorizing Provider  ARMOUR THYROID 120 MG tablet Take 120 mg by mouth Daily. 10/16/11  Yes Historical Provider, MD  ketoconazole (NIZORAL) 2 % shampoo As directed 10/08/11  Yes Historical Provider, MD  liothyronine (CYTOMEL) 5 MCG tablet Take 5 mcg by mouth Daily. 09/29/11  Yes Historical Provider, MD  naltrexone (DEPADE) 50 MG tablet Take 50 mg by mouth daily.   Yes Historical Provider, MD  progesterone (PROMETRIUM) 100 MG capsule Take by mouth daily. As directed   Yes Historical Provider, MD  spironolactone (ALDACTONE) 50 MG tablet Take 50 mg by mouth Daily. 10/08/11  Yes Historical Provider, MD     Allergies  Allergen Reactions  . Nitrous Oxide   . Percocet [Oxycodone-Acetaminophen] Itching  . Percodan [Oxycodone-Aspirin] Itching     Patient Active Problem List   Diagnosis Date Noted  . Hypothyroidism due to Hashimoto's thyroiditis 11/20/2014  . Menopausal and perimenopausal disorder 11/20/2014  . Antiphospholipid antibody positive      Family History  Problem Relation Age of Onset  . Cancer Maternal Aunt 60    breast   . Cancer Maternal Grandmother 60    breast  . Stroke  Maternal Grandfather   . Diabetes Mother   . Hyperlipidemia Mother   . Stroke Mother   . Hypertension Father      Social History   Social History  . Marital Status: Married    Spouse Name: Zerline Melchior  . Number of Children: 1  . Years of Education: 12th grade   Occupational History  . stay at home    Social History Main Topics  . Smoking status: Former Smoker -- 1.00 packs/day for 12 years    Quit date: 02/06/1998  . Smokeless tobacco: Never Used  . Alcohol Use: 0.0 oz/week    0 Standard drinks or equivalent per week     Comment: very little  . Drug Use: No  . Sexual Activity: Not on file   Other Topics Concern  . Not on file   Social History Narrative   Lives at home with her husband and their son, Ronnald Nian [facility]).        Review of Systems  Constitutional: Negative for fever, chills, diaphoresis and fatigue.  Respiratory: Negative for shortness of breath.   Cardiovascular: Negative for chest pain.  Gastrointestinal: Positive for nausea and abdominal pain. Negative for vomiting, diarrhea, constipation and abdominal distention.  Genitourinary: Positive for dysuria, frequency and decreased urine volume. Negative for urgency, hematuria, flank pain, vaginal discharge, difficulty urinating, pelvic pain and dyspareunia.  Musculoskeletal: Negative for myalgias and back pain.  Neurological: Positive  for dizziness, light-headedness and headaches. Negative for weakness.  Hematological: Negative for adenopathy.         Objective:  Physical Exam  Constitutional: She is oriented to person, place, and time. Vital signs are normal. She appears well-developed and well-nourished. No distress.  BP 108/72 mmHg  Pulse 86  Temp(Src) 98.3 F (36.8 C) (Oral)  Resp 18  Ht 5' 6.5" (1.689 m)  Wt 215 lb (97.523 kg)  BMI 34.19 kg/m2  SpO2 99%  LMP 10/29/2014 (Approximate)   HENT:  Head: Normocephalic and atraumatic.  Cardiovascular: Normal rate, regular rhythm and normal heart  sounds.   Pulmonary/Chest: Effort normal and breath sounds normal.  Abdominal: Soft. Normal appearance and bowel sounds are normal. She exhibits no distension and no mass. There is no hepatosplenomegaly. There is tenderness in the suprapubic area. There is no rigidity, no rebound, no guarding, no CVA tenderness, no tenderness at McBurney's point and negative Murphy's sign. No hernia.  Musculoskeletal: Normal range of motion.       Lumbar back: Normal.  Neurological: She is alert and oriented to person, place, and time.  Skin: Skin is warm and dry. No rash noted. She is not diaphoretic. No pallor.  Psychiatric: She has a normal mood and affect. Her speech is normal and behavior is normal. Judgment normal.   Results for orders placed or performed in visit on 11/20/14  POCT Microscopic Urinalysis ([facility])  Result Value Ref Range   WBC,UR,HPF,POC Moderate (A) None WBC/hpf   RBC,UR,HPF,POC Many (A) None RBC/hpf   Bacteria Few (A) None   Mucus Absent Absent   Epithelial Cells, UR Per Microscopy Few (A) None cells/hpf  POCT urinalysis dipstick  Result Value Ref Range   Color, UA orange (A) yellow   Clarity, UA cloudy (A) clear   Glucose, UA negative negative   Bilirubin, UA small (A) negative   Ketones, POC UA trace (5) (A) negative   Spec Grav, UA 1.025    Blood, UA large (A) negative   pH, UA 7.0    Protein Ur, POC =30 (A) negative   Urobilinogen, UA 1.0    Nitrite, UA Positive (A) Negative   Leukocytes, UA small (1+) (A) Negative             Assessment & Plan:  1. Pain with urination She appears to have a UTI. Empiric therapy with Septra DS. Await Ucx. Increase oral hydration. Reviewed post-coital habits to reduce risk of recurrence. - POCT Microscopic Urinalysis ([facility]) - POCT urinalysis dipstick - Urine culture - sulfamethoxazole-trimethoprim (BACTRIM DS,SEPTRA DS) 800-160 MG tablet; Take 1 tablet by mouth 2 (two) times daily.  Dispense: 10 tablet; Refill: 0   Fernande Brashelle S.  Fiona Coto, PA-C Physician Assistant-Certified [facility]

## 2014-11-20 NOTE — Telephone Encounter (Signed)
Patient's husband is calling because he thinks that the patient could have a possible kidney. He states that the pain has gotten worse. Please call paul! [phone]

## 2014-11-22 ENCOUNTER — Telehealth: Payer: Self-pay | Admitting: Physician Assistant

## 2014-11-22 LAB — URINE CULTURE: Colony Count: >=100000

## 2014-11-22 NOTE — Telephone Encounter (Signed)
Doing much better, thinks she has passed a stone. Reviewed urine culture results. Complete antibiotic. RTC PRN.

## 2015-01-12 ENCOUNTER — Other Ambulatory Visit: Payer: Self-pay | Admitting: Obstetrics and Gynecology

## 2015-01-12 DIAGNOSIS — Z803 Family history of malignant neoplasm of breast: Secondary | ICD-10-CM

## 2015-01-22 ENCOUNTER — Ambulatory Visit
Admission: RE | Admit: 2015-01-22 | Discharge: 2015-01-22 | Disposition: A | Discharge status: Home / Self Care | Payer: BC Managed Care – PPO | Source: Ambulatory Visit | Attending: Obstetrics and Gynecology | Admitting: Obstetrics and Gynecology

## 2015-01-22 DIAGNOSIS — Z803 Family history of malignant neoplasm of breast: Secondary | ICD-10-CM

## 2015-01-22 MED ORDER — GADOBENATE DIMEGLUMINE 529 MG/ML IV SOLN
20.0000 mL | Freq: Once | INTRAVENOUS | Status: AC | PRN
  Administered 2015-01-22: 20 mL via INTRAVENOUS

## 2015-04-23 ENCOUNTER — Ambulatory Visit (INDEPENDENT_AMBULATORY_CARE_PROVIDER_SITE_OTHER): Payer: BC Managed Care – PPO | Admitting: Family Medicine

## 2015-04-23 ENCOUNTER — Ambulatory Visit (INDEPENDENT_AMBULATORY_CARE_PROVIDER_SITE_OTHER): Payer: BC Managed Care – PPO

## 2015-04-23 VITALS — BP 122/86 | HR 90 | Temp 98.6°F | Resp 16 | Ht 66.5 in | Wt 210.0 lb

## 2015-04-23 DIAGNOSIS — R300 Dysuria: Secondary | ICD-10-CM

## 2015-04-23 DIAGNOSIS — Z87442 Personal history of urinary calculi: Secondary | ICD-10-CM

## 2015-04-23 DIAGNOSIS — R319 Hematuria, unspecified: Secondary | ICD-10-CM

## 2015-04-23 DIAGNOSIS — N3091 Cystitis, unspecified with hematuria: Secondary | ICD-10-CM | POA: Diagnosis not present

## 2015-04-23 LAB — POCT URINALYSIS DIP (MANUAL ENTRY)
Bilirubin, UA: NEGATIVE
GLUCOSE UA: NEGATIVE
Ketones, POC UA: NEGATIVE
NITRITE UA: NEGATIVE
PROTEIN UA: NEGATIVE
Spec Grav, UA: 1.025
UROBILINOGEN UA: 0.2
pH, UA: 5.5

## 2015-04-23 LAB — POC MICROSCOPIC URINALYSIS (UMFC): MUCUS RE: ABSENT

## 2015-04-23 MED ORDER — SULFAMETHOXAZOLE-TRIMETHOPRIM 800-160 MG PO TABS: 1.0000 | ORAL_TABLET | Freq: Two times a day (BID) | ORAL | Status: DC

## 2015-04-23 NOTE — Patient Instructions (Addendum)
I do not see a kidney stone on your x-ray. This appears to be a urinary tract infection. See information on this below. Start Septra antibiotic twice per day for 5 days. Drink plenty of fluids, and if not improving in the next 3-4 days, return for recheck. Sooner if worse. Return to the clinic or go to the nearest emergency room if any of your symptoms worsen or new symptoms occur.  Urinary Tract Infection Urinary tract infections (UTIs) can develop anywhere along your urinary tract. Your urinary tract is your body's drainage system for removing wastes and extra water. Your urinary tract includes two kidneys, two ureters, a bladder, and a urethra. Your kidneys are a pair of bean-shaped organs. Each kidney is about the size of your fist. They are located below your ribs, one on each side of your spine. CAUSES Infections are caused by microbes, which are microscopic organisms, including fungi, viruses, and bacteria. These organisms are so small that they can only be seen through a microscope. Bacteria are the microbes that most commonly cause UTIs. SYMPTOMS  Symptoms of UTIs may vary by age and gender of the patient and by the location of the infection. Symptoms in young women typically include a frequent and intense urge to urinate and a painful, burning feeling in the bladder or urethra during urination. Older women and men are more likely to be tired, shaky, and weak and have muscle aches and abdominal pain. A fever may mean the infection is in your kidneys. Other symptoms of a kidney infection include pain in your back or sides below the ribs, nausea, and vomiting. DIAGNOSIS To diagnose a UTI, your caregiver will ask you about your symptoms. Your caregiver will also ask you to provide a urine sample. The urine sample will be tested for bacteria and white blood cells. White blood cells are made by your body to help fight infection. TREATMENT  Typically, UTIs can be treated with medication. Because most  UTIs are caused by a bacterial infection, they usually can be treated with the use of antibiotics. The choice of antibiotic and length of treatment depend on your symptoms and the type of bacteria causing your infection. HOME CARE INSTRUCTIONS  If you were prescribed antibiotics, take them exactly as your caregiver instructs you. Finish the medication even if you feel better after you have only taken some of the medication.  Drink enough water and fluids to keep your urine clear or pale yellow.  Avoid caffeine, tea, and carbonated beverages. They tend to irritate your bladder.  Empty your bladder often. Avoid holding urine for long periods of time.  Empty your bladder before and after sexual intercourse.  After a bowel movement, women should cleanse from front to back. Use each tissue only once. SEEK MEDICAL CARE IF:   You have back pain.  You develop a fever.  Your symptoms do not begin to resolve within 3 days. SEEK IMMEDIATE MEDICAL CARE IF:   You have severe back pain or lower abdominal pain.  You develop chills.  You have nausea or vomiting.  You have continued burning or discomfort with urination. MAKE SURE YOU:   Understand these instructions.  Will watch your condition.  Will get help right away if you are not doing well or get worse.   This information is not intended to replace advice given to you by your health care provider. Make sure you discuss any questions you have with your health care provider.   Document Released: 11/02/2004 Document  Revised: 10/14/2014 Document Reviewed: 03/03/2011 Elsevier Interactive Patient Education 2016 [employer] Hematuria, Adult Hematuria is blood in your urine. It can be caused by a bladder infection, kidney infection, prostate infection, kidney stone, or cancer of your urinary tract. Infections can usually be treated with medicine, and a kidney stone usually will pass through your urine. If neither of these is the cause of  your hematuria, further workup to find out the reason may be needed. It is very important that you tell your health care provider about any blood you see in your urine, even if the blood stops without treatment or happens without causing pain. Blood in your urine that happens and then stops and then happens again can be a symptom of a very serious condition. Also, pain is not a symptom in the initial stages of many urinary cancers. HOME CARE INSTRUCTIONS   Drink lots of fluid, 3-4 quarts a day. If you have been diagnosed with an infection, cranberry juice is especially recommended, in addition to large amounts of water.  Avoid caffeine, tea, and carbonated beverages because they tend to irritate the bladder.  Avoid alcohol because it may irritate the prostate.  Take all medicines as directed by your health care provider.  If you were prescribed an antibiotic medicine, finish it all even if you start to feel better.  If you have been diagnosed with a kidney stone, follow your health care provider's instructions regarding straining your urine to catch the stone.  Empty your bladder often. Avoid holding urine for long periods of time.  After a bowel movement, women should cleanse front to back. Use each tissue only once.  Empty your bladder before and after sexual intercourse if you are a female. SEEK MEDICAL CARE IF:  You develop back pain.  You have a fever.  You have a feeling of sickness in your stomach (nausea) or vomiting.  Your symptoms are not better in 3 days. Return sooner if you are getting worse. SEEK IMMEDIATE MEDICAL CARE IF:   You develop severe vomiting and are unable to keep the medicine down.  You develop severe back or abdominal pain despite taking your medicines.  You begin passing a large amount of blood or clots in your urine.  You feel extremely weak or faint, or you pass out. MAKE SURE YOU:   Understand these instructions.  Will watch your  condition.  Will get help right away if you are not doing well or get worse.   This information is not intended to replace advice given to you by your health care provider. Make sure you discuss any questions you have with your health care provider.   Document Released: 01/23/2005 Document Revised: 02/13/2014 Document Reviewed: 09/23/2012 Elsevier Interactive Patient Education [employer] Inc[employer]

## 2015-04-23 NOTE — Progress Notes (Addendum)
Subjective:  By signing my name below, I, Stann Ore, attest that this documentation has been prepared under the direction and in the presence of Meredith Staggers, MD. Electronically Signed: Stann Ore, Scribe. 04/23/2015 , 11:41 AM .  Patient was seen in Room 4 .   Patient ID: Sherri Mullins, female    DOB: 09-Feb-1965, 50 y.o.   MRN: [id number] Chief Complaint  Patient presents with  . Dysuria    pressure and pain with urination   . Hematuria   HPI Sherri Mullins is a 50 y.o. female H/o nephrolithiasis. Pt was previously treated with UTI in oct 2016 with septra; culture at that time had multiple types of bacteria. Per previous note, possible kidney stone. Here today with pain with urination and blood in urine.   Patient states that her symptoms started yesterday with frequency yet decreased amount of urine. Then, she noticed hematuria, more blood this time than the previous time. She also felt some chills. She denies fever, abdominal pain or back pain. She last saw her urologist in November, and they informed her that it was caused by dehydration. She was able to catch the previous kidney stone.   Patient Active Problem List   Diagnosis Date Noted  . Hypothyroidism due to Hashimoto's thyroiditis 11/20/2014  . Menopausal and perimenopausal disorder 11/20/2014  . BMI 34.0-34.9,adult 11/20/2014  . Antiphospholipid antibody positive    Past Medical History  Diagnosis Date  . Antiphospholipid antibody positive   . Hyperlipidemia   . TIA (transient ischemic attack) 05/2010    ?  work; pressure; aphasia.  No documented stroke.   Marland Kitchen Hypothyroid   . Generalized edema   . Vitamin D deficiency   . Lumbar back pain     with steroid injection  (in 2009  . Arthritis    Past Surgical History  Procedure Laterality Date  . Cesarean section  2002  . Epidural steroid injection     Allergies  Allergen Reactions  . Nitrous Oxide   . Percocet [Oxycodone-Acetaminophen] Itching  . Percodan  [Oxycodone-Aspirin] Itching   Prior to Admission medications   Medication Sig Start Date End Date Taking? Authorizing Provider  ARMOUR THYROID 120 MG tablet Take 120 mg by mouth Daily. 10/16/11  Yes Historical Provider, MD  ketoconazole (NIZORAL) 2 % shampoo Reported on 04/23/2015 10/08/11  Yes Historical Provider, MD  Levothyroxine Sodium (TIROSINT) 25 MCG CAPS Take by mouth daily before breakfast.   Yes Historical Provider, MD  liothyronine (CYTOMEL) 5 MCG tablet Take 5 mcg by mouth Daily. 09/29/11  Yes Historical Provider, MD  naltrexone (DEPADE) 50 MG tablet Take 50 mg by mouth daily.   Yes Historical Provider, MD  progesterone (PROMETRIUM) 100 MG capsule Take by mouth daily. As directed   Yes Historical Provider, MD  spironolactone (ALDACTONE) 50 MG tablet Take 50 mg by mouth Daily. 10/08/11  Yes Historical Provider, MD   Social History   Social History  . Marital Status: Married    Spouse Name: Paden Kuras  . Number of Children: 1  . Years of Education: 12th grade   Occupational History  . stay at home    Social History Main Topics  . Smoking status: Former Smoker -- 1.00 packs/day for 12 years    Quit date: 02/06/1998  . Smokeless tobacco: Never Used  . Alcohol Use: 0.0 oz/week    0 Standard drinks or equivalent per week     Comment: very little  . Drug Use: No  . Sexual Activity:  Not on file   Other Topics Concern  . Not on file   Social History Narrative   Lives at home with her husband and their son, Ronnald Nian [facility]).   Review of Systems  Constitutional: Positive for chills. Negative for fever and diaphoresis.  Gastrointestinal: Negative for nausea, vomiting, abdominal pain, diarrhea, constipation and abdominal distention.  Genitourinary: Positive for dysuria, hematuria and decreased urine volume. Negative for flank pain.  Musculoskeletal: Negative for back pain.       Objective:   Physical Exam  Constitutional: She is oriented to person, place, and time. She appears  well-developed and well-nourished. No distress.  HENT:  Head: Normocephalic and atraumatic.  Eyes: EOM are normal. Pupils are equal, round, and reactive to light.  Neck: Neck supple.  Cardiovascular: Normal rate, regular rhythm and normal heart sounds.   No murmur heard. Pulmonary/Chest: Effort normal and breath sounds normal. No respiratory distress. She has no wheezes.  Abdominal: Bowel sounds are normal. She exhibits no distension. There is tenderness in the suprapubic area. There is no CVA tenderness.  Musculoskeletal: Normal range of motion.  Neurological: She is alert and oriented to person, place, and time.  Skin: Skin is warm and dry.  Psychiatric: She has a normal mood and affect. Her behavior is normal.  Nursing note and vitals reviewed.   Filed Vitals:   04/23/15 1058  BP: 122/86  Pulse: 90  Temp: 98.6 F (37 C)  TempSrc: Oral  Resp: 16  Height: 5' 6.5" (1.689 m)  Weight: 210 lb (95.255 kg)  SpO2: 98%   Results for orders placed or performed in visit on 04/23/15  POCT urinalysis dipstick  Result Value Ref Range   Color, UA yellow yellow   Clarity, UA clear clear   Glucose, UA negative negative   Bilirubin, UA negative negative   Ketones, POC UA negative negative   Spec Grav, UA 1.025    Blood, UA small (A) negative   pH, UA 5.5    Protein Ur, POC negative negative   Urobilinogen, UA 0.2    Nitrite, UA Negative Negative   Leukocytes, UA small (1+) (A) Negative  POCT Microscopic Urinalysis ([facility])  Result Value Ref Range   WBC,UR,HPF,POC Moderate (A) None WBC/hpf   RBC,UR,HPF,POC Few (A) None RBC/hpf   Bacteria Moderate (A) None, Too numerous to count   Mucus Absent Absent   Epithelial Cells, UR Per Microscopy Few (A) None, Too numerous to count cells/hpf   Dg Abd 1 View  04/23/2015  CLINICAL DATA:  Groin pain. Question urinary tract stone. Initial encounter. EXAM: ABDOMEN - 1 VIEW COMPARISON:  None. FINDINGS: The bowel gas pattern is normal. No  radio-opaque calculi or other significant radiographic abnormality are seen. IMPRESSION: Negative exam. Electronically Signed   By: Drusilla Kanner M.D.   On: 04/23/2015 12:10       Assessment & Plan:   Brownie Gockel is a 50 y.o. female Dysuria - Plan: POCT urinalysis dipstick, POCT Microscopic Urinalysis ([facility]), DG Abd 1 View  Hematuria - Plan: DG Abd 1 View  History of nephrolithiasis - Plan: DG Abd 1 View  Hemorrhagic cystitis - Plan: Urine culture, sulfamethoxazole-trimethoprim (BACTRIM DS,SEPTRA DS) 800-160 MG tablet   Suspected hemorrhagic cystitis/UTI. No sign of nephrolithiasis at this time. Start Septra DS, urine culture, fluids, RTC precautions.   Meds ordered this encounter  Medications  . DISCONTD: Levothyroxine Sodium (TIROSINT) 25 MCG CAPS    Sig: Take by mouth daily before breakfast.  . TIROSINT 100  MCG CAPS    Sig: Take 1 capsule by mouth every morning.    Refill:  4  . spironolactone (ALDACTONE) 25 MG tablet    Sig: Take 25 mg by mouth 2 (two) times daily.    Refill:  2  . sulfamethoxazole-trimethoprim (BACTRIM DS,SEPTRA DS) 800-160 MG tablet    Sig: Take 1 tablet by mouth 2 (two) times daily.    Dispense:  10 tablet    Refill:  0   Patient Instructions  I do not see a kidney stone on your x-ray. This appears to be a urinary tract infection. See information on this below. Start Septra antibiotic twice per day for 5 days. Drink plenty of fluids, and if not improving in the next 3-4 days, return for recheck. Sooner if worse. Return to the clinic or go to the nearest emergency room if any of your symptoms worsen or new symptoms occur.  Urinary Tract Infection Urinary tract infections (UTIs) can develop anywhere along your urinary tract. Your urinary tract is your body's drainage system for removing wastes and extra water. Your urinary tract includes two kidneys, two ureters, a bladder, and a urethra. Your kidneys are a pair of bean-shaped organs. Each kidney is  about the size of your fist. They are located below your ribs, one on each side of your spine. CAUSES Infections are caused by microbes, which are microscopic organisms, including fungi, viruses, and bacteria. These organisms are so small that they can only be seen through a microscope. Bacteria are the microbes that most commonly cause UTIs. SYMPTOMS  Symptoms of UTIs may vary by age and gender of the patient and by the location of the infection. Symptoms in young women typically include a frequent and intense urge to urinate and a painful, burning feeling in the bladder or urethra during urination. Older women and men are more likely to be tired, shaky, and weak and have muscle aches and abdominal pain. A fever may mean the infection is in your kidneys. Other symptoms of a kidney infection include pain in your back or sides below the ribs, nausea, and vomiting. DIAGNOSIS To diagnose a UTI, your caregiver will ask you about your symptoms. Your caregiver will also ask you to provide a urine sample. The urine sample will be tested for bacteria and white blood cells. White blood cells are made by your body to help fight infection. TREATMENT  Typically, UTIs can be treated with medication. Because most UTIs are caused by a bacterial infection, they usually can be treated with the use of antibiotics. The choice of antibiotic and length of treatment depend on your symptoms and the type of bacteria causing your infection. HOME CARE INSTRUCTIONS  If you were prescribed antibiotics, take them exactly as your caregiver instructs you. Finish the medication even if you feel better after you have only taken some of the medication.  Drink enough water and fluids to keep your urine clear or pale yellow.  Avoid caffeine, tea, and carbonated beverages. They tend to irritate your bladder.  Empty your bladder often. Avoid holding urine for long periods of time.  Empty your bladder before and after sexual  intercourse.  After a bowel movement, women should cleanse from front to back. Use each tissue only once. SEEK MEDICAL CARE IF:   You have back pain.  You develop a fever.  Your symptoms do not begin to resolve within 3 days. SEEK IMMEDIATE MEDICAL CARE IF:   You have severe back pain or lower  abdominal pain.  You develop chills.  You have nausea or vomiting.  You have continued burning or discomfort with urination. MAKE SURE YOU:   Understand these instructions.  Will watch your condition.  Will get help right away if you are not doing well or get worse.   This information is not intended to replace advice given to you by your health care provider. Make sure you discuss any questions you have with your health care provider.   Document Released: 11/02/2004 Document Revised: 10/14/2014 Document Reviewed: 03/03/2011 Elsevier Interactive Patient Education 2016 [employer]vier Inc[employer] Hematuria, Adult Hematuria is blood in your urine. It can be caused by a bladder infection, kidney infection, prostate infection, kidney stone, or cancer of your urinary tract. Infections can usually be treated with medicine, and a kidney stone usually will pass through your urine. If neither of these is the cause of your hematuria, further workup to find out the reason may be needed. It is very important that you tell your health care provider about any blood you see in your urine, even if the blood stops without treatment or happens without causing pain. Blood in your urine that happens and then stops and then happens again can be a symptom of a very serious condition. Also, pain is not a symptom in the initial stages of many urinary cancers. HOME CARE INSTRUCTIONS   Drink lots of fluid, 3-4 quarts a day. If you have been diagnosed with an infection, cranberry juice is especially recommended, in addition to large amounts of water.  Avoid caffeine, tea, and carbonated beverages because they tend to irritate  the bladder.  Avoid alcohol because it may irritate the prostate.  Take all medicines as directed by your health care provider.  If you were prescribed an antibiotic medicine, finish it all even if you start to feel better.  If you have been diagnosed with a kidney stone, follow your health care provider's instructions regarding straining your urine to catch the stone.  Empty your bladder often. Avoid holding urine for long periods of time.  After a bowel movement, women should cleanse front to back. Use each tissue only once.  Empty your bladder before and after sexual intercourse if you are a female. SEEK MEDICAL CARE IF:  You develop back pain.  You have a fever.  You have a feeling of sickness in your stomach (nausea) or vomiting.  Your symptoms are not better in 3 days. Return sooner if you are getting worse. SEEK IMMEDIATE MEDICAL CARE IF:   You develop severe vomiting and are unable to keep the medicine down.  You develop severe back or abdominal pain despite taking your medicines.  You begin passing a large amount of blood or clots in your urine.  You feel extremely weak or faint, or you pass out. MAKE SURE YOU:   Understand these instructions.  Will watch your condition.  Will get help right away if you are not doing well or get worse.   This information is not intended to replace advice given to you by your health care provider. Make sure you discuss any questions you have with your health care provider.   Document Released: 01/23/2005 Document Revised: 02/13/2014 Document Reviewed: 09/23/2012 Elsevier Interactive Patient Education [employer]     I personally performed the services described in this documentation, which was scribed in my presence. The recorded information has been reviewed and considered, and addended by me as needed.

## 2015-04-25 LAB — URINE CULTURE: Colony Count: >=100000

## 2016-01-07 LAB — HM PAP SMEAR: HM PAP: NEGATIVE

## 2016-01-10 ENCOUNTER — Other Ambulatory Visit: Payer: Self-pay | Admitting: Obstetrics and Gynecology

## 2016-01-10 DIAGNOSIS — N6310 Unspecified lump in the right breast, unspecified quadrant: Secondary | ICD-10-CM

## 2016-01-13 ENCOUNTER — Ambulatory Visit
Admission: RE | Admit: 2016-01-13 | Discharge: 2016-01-13 | Disposition: A | Discharge status: Home / Self Care | Payer: BC Managed Care – PPO | Source: Ambulatory Visit | Attending: Obstetrics and Gynecology | Admitting: Obstetrics and Gynecology

## 2016-01-13 DIAGNOSIS — N6310 Unspecified lump in the right breast, unspecified quadrant: Secondary | ICD-10-CM

## 2016-10-04 ENCOUNTER — Encounter: Payer: Self-pay | Admitting: Adult Health

## 2016-10-04 ENCOUNTER — Ambulatory Visit (INDEPENDENT_AMBULATORY_CARE_PROVIDER_SITE_OTHER): Payer: BC Managed Care – PPO | Admitting: Adult Health

## 2016-10-04 VITALS — BP 105/73 | HR 71 | Ht 66.5 in | Wt 208.9 lb

## 2016-10-04 DIAGNOSIS — R760 Raised antibody titer: Secondary | ICD-10-CM

## 2016-10-04 DIAGNOSIS — E038 Other specified hypothyroidism: Secondary | ICD-10-CM

## 2016-10-04 DIAGNOSIS — J209 Acute bronchitis, unspecified: Secondary | ICD-10-CM | POA: Insufficient documentation

## 2016-10-04 DIAGNOSIS — E063 Autoimmune thyroiditis: Secondary | ICD-10-CM

## 2016-10-04 DIAGNOSIS — Z0000 Encounter for general adult medical examination without abnormal findings: Secondary | ICD-10-CM | POA: Insufficient documentation

## 2016-10-04 MED ORDER — AZITHROMYCIN 250 MG PO TABS: ORAL_TABLET | ORAL | 0 refills | Status: DC

## 2016-10-04 NOTE — Assessment & Plan Note (Signed)
Continue all medications as directed. Increased water intake, strive for at least >100 ounces/day. Continue regular exercise and try to avoid soda/high sugar foods and drinks. Continue regular follow-up with your Endocrinologist. Please schedule CPE with fasting labs this fall.

## 2016-10-04 NOTE — Progress Notes (Signed)
Subjective:    Patient ID: Sherri Mullins, female    DOB: 08-25-1965, 51 y.o.   MRN: [id number]  HPI:  [name] presents to establish as a new pt.  She is a very pleasant 51 year old female. PMH:  PCOS, Hypothyroidism, hx of positive Antiphospholid antibidy-full work up with Hemat/Onc 11/08/11, results negative. She was under great deal of stress in 2012 and experienced brief period of aphasia.  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 She was struggling with extreme fatigue and body aches and could "not get my thyroid levels under control" then she found a "holistic" endocrinologist and he changed her thyroid medications and additionally dx'd her with lyme disease (vector was likely a mosquito since she cannot recall ever having been bitten by a tick). She stopped smoking 2000 and follows "a clean diet, however I do drink soda and I love sugar". She walks 2-3 miles a day and exercises as local gym several days a week. She has one acute complaint:  Non-productive cough, intermittent wheezing, and clear nasal congestion that have been occurring >6 weeks.    Patient Care Team    Relationship Specialty Notifications Start End  William Hamburger D, NP PCP - General Family Medicine  10/04/16   Jonnie Kind, MD Attending Physician Family Medicine  11/20/14     Patient Active Problem List   Diagnosis Date Noted  . Hypothyroidism due to Hashimoto's thyroiditis 11/20/2014  . Menopausal and perimenopausal disorder 11/20/2014  . BMI 34.0-34.9,adult 11/20/2014  . Antiphospholipid antibody positive      Past Medical History:  Diagnosis Date  . Antiphospholipid antibody positive   . Arthritis   . Generalized edema   . Hyperlipidemia   . Hypothyroid   . Lumbar back pain    with steroid injection  (in 2009  . Lyme disease   . 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  PCOS (polycystic ovarian syndrome)   . TIA (transient ischemic attack) 05/2010   ?  work; pressure; aphasia.  No documented stroke.   . Vitamin D deficiency      Past Surgical History:  Procedure Laterality Date  . CESAREAN SECTION  2002  . epidural steroid injection       Family History  Problem Relation Age of Onset  . Cancer Maternal Aunt 60       breast   . Cancer Maternal Grandmother 60       breast  . Stroke Maternal Grandfather   . Diabetes Mother   . Hyperlipidemia Mother   . Stroke Mother   . Hypertension Father   . Healthy Brother   . Healthy Brother      History  Drug Use No     History  Alcohol Use  . 0.0 oz/week    Comment: very little     History  Smoking Status  . Former Smoker  . Packs/day: 1.00  . Years: 12.00  . Types: Cigarettes  . Quit date: 02/06/1998  Smokeless Tobacco  . Never Used     Outpatient Encounter Prescriptions as of 10/04/2016  Medication Sig Note  . liothyronine (CYTOMEL) 5 MCG tablet Take 5 mcg by mouth Daily.   . naltrexone (DEPADE) 50 MG tablet Take 50 mg by mouth daily.   . progesterone (PROMETRIUM) 100 MG capsule Take by mouth daily. As directed   . spironolactone (ALDACTONE)  50 MG tablet Take 1 tablet by mouth daily.   Marland Kitchen thyroid (NP THYROID) 120 MG tablet Take 120 mg by mouth daily before breakfast.   . TIROSINT 150 MCG CAPS Take 1 capsule by mouth daily.   . [DISCONTINUED] ARMOUR THYROID 120 MG tablet Take 120 mg by mouth Daily.   . [DISCONTINUED] ketoconazole (NIZORAL) 2 % shampoo Reported on 04/23/2015   . [DISCONTINUED] spironolactone (ALDACTONE) 25 MG tablet Take 25 mg by mouth 2 (two) times daily. 04/23/2015: Received from: External Pharmacy  . [DISCONTINUED] sulfamethoxazole-trimethoprim (BACTRIM DS,SEPTRA DS) 800-160 MG tablet Take 1 tablet by mouth 2 (two) times daily.   . [DISCONTINUED] TIROSINT 100 MCG CAPS Take 1 capsule by mouth every morning. 04/23/2015: Received from: External Pharmacy   No  facility-administered encounter medications on file as of 10/04/2016.     Allergies: Nitrous oxide; Percocet [oxycodone-acetaminophen]; and Percodan [oxycodone-aspirin]  Body mass index is 33.21 kg/m.  Blood pressure 105/73, pulse 71, height 5' 6.5" (1.689 m), weight 208 lb 14.4 oz (94.8 kg).   Review of Systems  Constitutional: Negative for activity change, appetite change, chills, diaphoresis, fever and unexpected weight change.  HENT: Negative for congestion, postnasal drip and rhinorrhea.   Eyes: Negative for visual disturbance.  Respiratory: Positive for cough and wheezing. Negative for chest tightness, shortness of breath and stridor.   Cardiovascular: Negative for chest pain and leg swelling.  Gastrointestinal: Negative for abdominal distention, abdominal pain, blood in stool, constipation, diarrhea, nausea and vomiting.  Endocrine: Negative for cold intolerance, heat intolerance, polydipsia, polyphagia and polyuria.  Genitourinary: Negative for difficulty urinating, dysuria, flank pain and hematuria.  Musculoskeletal: Negative for arthralgias, back pain, gait problem, joint swelling, myalgias, neck pain and neck stiffness.  Skin: Negative for color change, pallor, rash and wound.  Neurological: Negative for dizziness, tremors, weakness and headaches.  Hematological: Does not bruise/bleed easily.  Psychiatric/Behavioral: Negative for decreased concentration, dysphoric mood, hallucinations, self-injury, sleep disturbance and suicidal ideas. The patient is not nervous/anxious and is not hyperactive.        Objective:   Physical Exam  Constitutional: She is oriented to person, place, and time. She appears well-developed and well-nourished. No distress.  HENT:  Head: Normocephalic and atraumatic.  Right Ear: Hearing, external ear and ear canal normal. Tympanic membrane is bulging. Tympanic membrane is not erythematous. No decreased hearing is noted.  Left Ear: Hearing, external  ear and ear canal normal. Tympanic membrane is bulging. Tympanic membrane is not erythematous.  Nose: Nose normal. Right sinus exhibits no maxillary sinus tenderness and no frontal sinus tenderness. Left sinus exhibits no maxillary sinus tenderness and no frontal sinus tenderness.  Mouth/Throat: Uvula is midline, oropharynx is clear and moist and mucous membranes are normal.  Eyes: Pupils are equal, round, and reactive to light. Conjunctivae are normal.  Neck: Normal range of motion. Neck supple.  Cardiovascular: Normal rate, regular rhythm and normal heart sounds.   No murmur heard. Pulmonary/Chest: Effort normal and breath sounds normal. No respiratory distress. She has no decreased breath sounds. She has no wheezes. She has no rhonchi. She has no rales. She exhibits no tenderness.  Constant, non-productive cough noted during examination.  Lymphadenopathy:    She has no cervical adenopathy.  Neurological: She is alert and oriented to person, place, and time.  Skin: Skin is warm and dry. No rash noted. She is not diaphoretic. No erythema. No pallor.  Psychiatric: She has a normal mood and affect. Her behavior is normal. Judgment and thought content normal.  Nursing note and vitals reviewed.         Assessment & Plan:   1. Healthcare maintenance   2. Hypothyroidism due to Hashimoto's thyroiditis   3. Antiphospholipid antibody positive   4. Acute bronchitis, unspecified organism     Hypothyroidism due to Hashimoto's thyroiditis Managed by Endocrinology. Reports dramatic reduction in fatigue in last 6 months due to medication adjustments. Currently taking Thyroid 120mg  and Liothyronine .  Antiphospholipid antibody positive Negative workup by Hemat/Onc 11/2011. She never required/requested f/u.  Healthcare maintenance Continue all medications as directed. Increased water intake, strive for at least >100 ounces/day. Continue regular exercise and try to avoid soda/high sugar  foods and drinks. Continue regular follow-up with your Endocrinologist. Please schedule CPE with fasting labs this fall.  Bronchitis, acute Since sx's have been present >6 weeks, she was provided Azithromycin. Increase water/rest/vit c. If sx's persist after ABX completed, please call clinic.     FOLLOW-UP:  Return in about 3 months (around 01/04/2017) for CPE, Fasting Lab Draw.

## 2016-10-04 NOTE — Assessment & Plan Note (Signed)
Managed by Endocrinology. Reports dramatic reduction in fatigue in last 6 months due to medication adjustments. Currently taking Thyroid 120mg  and Liothyronine .

## 2016-10-04 NOTE — Patient Instructions (Signed)
Heart-Healthy Eating Plan Many factors influence your heart health, including eating and exercise habits. Heart (coronary) risk increases with abnormal blood fat (lipid) levels. Heart-healthy meal planning includes limiting unhealthy fats, increasing healthy fats, and making other small dietary changes. This includes maintaining a healthy body weight to help keep lipid levels within a normal range. What is my plan? Your health care provider recommends that you:  Get no more than __25__% of the total calories in your daily diet from fat.  Limit your intake of saturated fat to less than __5___% of your total calories each day.  Limit the amount of cholesterol in your diet to less than __300__ mg per day.  What types of fat should I choose?  Choose healthy fats more often. Choose monounsaturated and polyunsaturated fats, such as olive oil and canola oil, flaxseeds, walnuts, almonds, and seeds.  Eat more omega-3 fats. Good choices include salmon, mackerel, sardines, tuna, flaxseed oil, and ground flaxseeds. Aim to eat fish at least two times each week.  Limit saturated fats. Saturated fats are primarily found in animal products, such as meats, butter, and cream. Plant sources of saturated fats include palm oil, palm kernel oil, and coconut oil.  Avoid foods with partially hydrogenated oils in them. These contain trans fats. Examples of foods that contain trans fats are stick margarine, some tub margarines, cookies, crackers, and other baked goods. What general guidelines do I need to follow?  Check food labels carefully to identify foods with trans fats or high amounts of saturated fat.  Fill one half of your plate with vegetables and green salads. Eat 4-5 servings of vegetables per day. A serving of vegetables equals 1 cup of raw leafy vegetables,  cup of raw or cooked cut-up vegetables, or  cup of vegetable juice.  Fill one fourth of your plate with whole grains. Look for the word "whole"  as the first word in the ingredient list.  Fill one fourth of your plate with lean protein foods.  Eat 4-5 servings of fruit per day. A serving of fruit equals one medium whole fruit,  cup of dried fruit,  cup of fresh, frozen, or canned fruit, or  cup of 100% fruit juice.  Eat more foods that contain soluble fiber. Examples of foods that contain this type of fiber are apples, broccoli, carrots, beans, peas, and barley. Aim to get 20-30 g of fiber per day.  Eat more home-cooked food and less restaurant, buffet, and fast food.  Limit or avoid alcohol.  Limit foods that are high in starch and sugar.  Avoid fried foods.  Cook foods by using methods other than frying. Baking, boiling, grilling, and broiling are all great options. Other fat-reducing suggestions include: ? Removing the skin from poultry. ? Removing all visible fats from meats. ? Skimming the fat off of stews, soups, and gravies before serving them. ? Steaming vegetables in water or broth.  Lose weight if you are overweight. Losing just 5-10% of your initial body weight can help your overall health and prevent diseases such as diabetes and heart disease.  Increase your consumption of nuts, legumes, and seeds to 4-5 servings per week. One serving of dried beans or legumes equals  cup after being cooked, one serving of nuts equals 1 ounces, and one serving of seeds equals  ounce or 1 tablespoon.  You may need to monitor your salt (sodium) intake, especially if you have high blood pressure. Talk with your health care provider or dietitian to get  more information about reducing sodium. What foods can I eat? Grains  Breads, including [place], white, pita, wheat, raisin, rye, oatmeal, and [place]. Tortillas that are neither fried nor made with lard or trans fat. Low-fat rolls, including hotdog and hamburger buns and English muffins. Biscuits. Muffins. Waffles. Pancakes. Light popcorn. Whole-grain cereals. Flatbread. Melba  toast. Pretzels. Breadsticks. Rusks. Low-fat snacks and crackers, including oyster, saltine, matzo, graham, animal, and rye. Rice and pasta, including brown rice and those that are made with whole wheat. Vegetables All vegetables. Fruits All fruits, but limit coconut. Meats and Other Protein Sources Lean, well-trimmed beef, veal, pork, and lamb. Chicken and [place] without skin. All fish and shellfish. Wild duck, rabbit, pheasant, and venison. Egg whites or low-cholesterol egg substitutes. Dried beans, peas, lentils, and tofu.Seeds and most nuts. Dairy Low-fat or nonfat cheeses, including ricotta, string, and mozzarella. Skim or 1% milk that is liquid, powdered, or evaporated. Buttermilk that is made with low-fat milk. Nonfat or low-fat yogurt. Beverages Mineral water. Diet carbonated beverages. Sweets and Desserts Sherbets and fruit ices. Honey, jam, marmalade, jelly, and syrups. Meringues and gelatins. Pure sugar candy, such as hard candy, jelly beans, gumdrops, mints, marshmallows, and small amounts of dark chocolate. [employer] Eat all sweets and desserts in moderation. Fats and Oils Nonhydrogenated (trans-free) margarines. Vegetable oils, including soybean, sesame, sunflower, olive, peanut, safflower, corn, canola, and cottonseed. Salad dressings or mayonnaise that are made with a vegetable oil. Limit added fats and oils that you use for cooking, baking, salads, and as spreads. Other Cocoa powder. Coffee and tea. All seasonings and condiments. The items listed above may not be a complete list of recommended foods or beverages. Contact your dietitian for more options. What foods are not recommended? Grains Breads that are made with saturated or trans fats, oils, or whole milk. Croissants. Butter rolls. Cheese breads. Sweet rolls. Donuts. Buttered popcorn. Chow mein noodles. High-fat crackers, such as cheese or butter crackers. Meats and Other Protein Sources Fatty meats, such as  hotdogs, short ribs, sausage, spareribs, bacon, ribeye roast or steak, and mutton. High-fat deli meats, such as salami and bologna. Caviar. Domestic duck and goose. Organ meats, such as kidney, liver, sweetbreads, brains, gizzard, chitterlings, and heart. Dairy Cream, sour cream, cream cheese, and creamed cottage cheese. Whole milk cheeses, including blue (bleu), Monterey Jack, [place], [place], American, [place], [place], [place], [place], and [place]. Whole or 2% milk that is liquid, evaporated, or condensed. Whole buttermilk. Cream sauce or high-fat cheese sauce. Yogurt that is made from whole milk. Beverages Regular sodas and drinks with added sugar. Sweets and Desserts Frosting. Pudding. Cookies. Cakes other than angel food cake. Candy that has milk chocolate or white chocolate, hydrogenated fat, butter, coconut, or unknown ingredients. Buttered syrups. Full-fat ice cream or ice cream drinks. Fats and Oils Gravy that has suet, meat fat, or shortening. Cocoa butter, hydrogenated oils, palm oil, coconut oil, palm kernel oil. These can often be found in baked products, candy, fried foods, nondairy creamers, and whipped toppings. Solid fats and shortenings, including bacon fat, salt pork, lard, and butter. Nondairy cream substitutes, such as coffee creamers and sour cream substitutes. Salad dressings that are made of unknown oils, cheese, or sour cream. The items listed above may not be a complete list of foods and beverages to avoid. Contact your dietitian for more information. This information is not intended to replace advice given to you by your health care provider. Make sure you discuss any questions you have with your health care  provider. Document Released: 11/02/2007 Document Revised: 08/13/2015 Document Reviewed: 07/17/2013 Elsevier Interactive Patient Education  2017 [employer] Inc.   Acute Bronchitis, Adult Acute bronchitis is sudden (acute) swelling of the air tubes (bronchi) in the lungs.  Acute bronchitis causes these tubes to fill with mucus, which can make it hard to breathe. It can also cause coughing or wheezing. In adults, acute bronchitis usually goes away within 2 weeks. A cough caused by bronchitis may last up to 3 weeks. Smoking, allergies, and asthma can make the condition worse. Repeated episodes of bronchitis may cause further lung problems, such as chronic obstructive pulmonary disease (COPD). What are the causes? This condition can be caused by germs and by substances that irritate the lungs, including:  Cold and flu viruses. This condition is most often caused by the same virus that causes a cold.  Bacteria.  Exposure to tobacco smoke, dust, fumes, and air pollution.  What increases the risk? This condition is more likely to develop in people who:  Have close contact with someone with acute bronchitis.  Are exposed to lung irritants, such as tobacco smoke, dust, fumes, and vapors.  Have a weak immune system.  Have a respiratory condition such as asthma.  What are the signs or symptoms? Symptoms of this condition include:  A cough.  Coughing up clear, yellow, or green mucus.  Wheezing.  Chest congestion.  Shortness of breath.  A fever.  Body aches.  Chills.  A sore throat.  How is this diagnosed? This condition is usually diagnosed with a physical exam. During the exam, your health care provider may order tests, such as chest X-rays, to rule out other conditions. He or she may also:  Test a sample of your mucus for bacterial infection.  Check the level of oxygen in your blood. This is done to check for pneumonia.  Do a chest X-ray or lung function testing to rule out pneumonia and other conditions.  Perform blood tests.  Your health care provider will also ask about your symptoms and medical history. How is this treated? Most cases of acute bronchitis clear up over time without treatment. Your health care provider may  recommend:  Drinking more fluids. Drinking more makes your mucus thinner, which may make it easier to breathe.  Taking a medicine for a fever or cough.  Taking an antibiotic medicine.  Using an inhaler to help improve shortness of breath and to control a cough.  Using a cool mist vaporizer or humidifier to make it easier to breathe.  Follow these instructions at home: Medicines  Take over-the-counter and prescription medicines only as told by your health care provider.  If you were prescribed an antibiotic, take it as told by your health care provider. Do not stop taking the antibiotic even if you start to feel better. General instructions  Get plenty of rest.  Drink enough fluids to keep your urine clear or pale yellow.  Avoid smoking and secondhand smoke. Exposure to cigarette smoke or irritating chemicals will make bronchitis worse. If you smoke and you need help quitting, ask your health care provider. Quitting smoking will help your lungs heal faster.  Use an inhaler, cool mist vaporizer, or humidifier as told by your health care provider.  Keep all follow-up visits as told by your health care provider. This is important. How is this prevented? To lower your risk of getting this condition again:  Wash your hands often with soap and water. If soap and water are not available,  use hand sanitizer.  Avoid contact with people who have cold symptoms.  Try not to touch your hands to your mouth, nose, or eyes.  Make sure to get the flu shot every year.  Contact a health care provider if:  Your symptoms do not improve in 2 weeks of treatment. Get help right away if:  You cough up blood.  You have chest pain.  You have severe shortness of breath.  You become dehydrated.  You faint or keep feeling like you are going to faint.  You keep vomiting.  You have a severe headache.  Your fever or chills gets worse. This information is not intended to replace advice given  to you by your health care provider. Make sure you discuss any questions you have with your health care provider. Document Released: 03/02/2004 Document Revised: 08/18/2015 Document Reviewed: 07/14/2015 Elsevier Interactive Patient Education  2017 [employer].   Continue all medications as directed. Increased water intake, strive for at least >100 ounces/day. Please take antibiotic as directed and once you feel better resume your excellent exercise routine. Continue regular follow-up with your Endocrinologist. Please schedule CPE with fasting labs this fall. WELCOME TO THE PRACTICE!

## 2016-10-04 NOTE — Assessment & Plan Note (Signed)
Since sx's have been present >6 weeks, she was provided Azithromycin. Increase water/rest/vit c. If sx's persist after ABX completed, please call clinic.

## 2016-10-04 NOTE — Assessment & Plan Note (Signed)
Negative workup by Hemat/Onc 11/2011. She never required/requested f/u.

## 2016-10-17 ENCOUNTER — Telehealth: Payer: Self-pay | Admitting: Adult Health

## 2016-10-17 ENCOUNTER — Other Ambulatory Visit: Payer: Self-pay | Admitting: Adult Health

## 2016-10-17 DIAGNOSIS — J209 Acute bronchitis, unspecified: Secondary | ICD-10-CM

## 2016-10-17 MED ORDER — AMOXICILLIN-POT CLAVULANATE 875-125 MG PO TABS: 1.0000 | ORAL_TABLET | Freq: Two times a day (BID) | ORAL | 0 refills | Status: DC

## 2016-10-17 NOTE — Telephone Encounter (Signed)
Patient called stating she still is not feeling well and would like a different antibiotic to help with the head cold. Please advise, if approved, patient uses [facility] [address]

## 2016-10-17 NOTE — Telephone Encounter (Signed)
Good Afternoon [name], Can you please call Ms. Sherri Mullins and tell her that I sent in rx for Augmentin. Please ask if she gets a yeast infection when using ABX. Thanks! Orpha BurKaty

## 2016-10-17 NOTE — Telephone Encounter (Signed)
Please advise.  T. Cylas Falzone, CMA 

## 2016-10-17 NOTE — Telephone Encounter (Signed)
Pt informed.  Pt states that she does not get yeast infections with antibiotics.  Pt expressed understanding and is agreeable.  Tiajuana Amass. Ariana Cavenaugh, CMA

## 2016-10-17 NOTE — Progress Notes (Signed)
Pt failed Azithromycin, started on Augmentin course.

## 2016-10-17 NOTE — Telephone Encounter (Signed)
Please advise.  T. Timesha Cervantez, CMA 

## 2017-01-02 NOTE — Progress Notes (Signed)
Subjective:    Patient ID: Sherri Mullins, female    DOB: [birth year], 51 y.o.   MRN: [id number]  HPI:  10/04/16 OV: Ms. Sherri Mullins presents to establish as a new pt.  She is a very pleasant 51 year old female. PMH:  PCOS, Hypothyroidism, hx of positive Antiphospholid antibidy-full work up with Hemat/Onc 11/08/11, results negative. She was under great deal of stress in 2012 and experienced brief period of aphasia.  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 She was struggling with extreme fatigue and body aches and could "not get my thyroid levels under control" then she found a "holistic" endocrinologist and he changed her thyroid medications and additionally dx'd her with lyme disease (vector was likely a mosquito since she cannot recall ever having been bitten by a tick). She stopped smoking 2000 and follows "a clean diet, however I do drink soda and I love sugar". She walks 2-3 miles a day and exercises as local gym several days a week. She has one acute complaint:  Non-productive cough, intermittent wheezing, and clear nasal congestion that have been occurring >6 weeks.  01/04/17 OV: Ms. Sherri Mullins is here for CPE.   She has URI in Late August/Early Sept, she failed Azithromycin then placed on Augmentin.  She was still having the a "ghost cough" when she saw her Endocrinologist and he ran "pnuemonia blood panel and showed I had pneumonia".  She was given course of Minocycline and completed last week, however still having an intermittent non-productive cough.  She denies fever/night sweats/chills/malaise/poor appetite. She cooks/eats clean and has been unable to exercise recently due "this cough".   Her Endocrinologist changed her thyroid medications, now currently Liothyronine (Cytomel) 5mcg daily, Armour 120mg  each am, and Armour 90 each afternoon.  She reports reduction in fatigue with the recent change. She  passed another kidney stone in Oct- followed by Urologist.  We discussed that she should only drink water and completely eliminate soda, which will improve kidney fx.   Patient Care Team    Relationship Specialty Notifications Start End  William Hamburgeranford, Sabin Gibeault D, NP PCP - General Family Medicine  10/04/16   Jonnie KindSaunders, Weston W, MD Attending Physician Family Medicine  11/20/14   G[place], [facility]    10/04/16     Patient Active Problem List   Diagnosis Date Noted  . Persistent cough 01/04/2017  . Bronchitis, acute 10/04/2016  . Healthcare maintenance 10/04/2016  . Hypothyroidism due to Hashimoto's thyroiditis 11/20/2014  . Menopausal and perimenopausal disorder 11/20/2014  . BMI 34.0-34.9,adult 11/20/2014  . Antiphospholipid antibody positive      Past Medical History:  Diagnosis Date  . Antiphospholipid antibody positive   . Arthritis   . Generalized edema   . Hyperlipidemia   . Hypothyroid   . Lumbar back pain    with steroid injection  (in 2009  . Lyme disease   . 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 PCOS (polycystic ovarian syndrome)   . TIA (transient ischemic attack) 05/2010   ?  work; pressure; aphasia.  No documented stroke.   . Vitamin D deficiency      Past Surgical History:  Procedure Laterality Date  . CESAREAN SECTION  2002  . epidural steroid injection       Family History  Problem Relation Age of Onset  . Cancer Maternal Aunt 60  breast   . Cancer Maternal Grandmother 60       breast  . Stroke Maternal Grandfather   . Diabetes Mother   . Hyperlipidemia Mother   . Stroke Mother   . Hypertension Father   . Healthy Brother   . Healthy Brother      Social History   Substance and Sexual Activity  Drug Use No     Social History   Substance and Sexual Activity  Alcohol Use Yes  . Alcohol/week: 0.0 oz   Comment: very little     Social History   Tobacco Use  Smoking Status Former Smoker   . Packs/day: 1.00  . Years: 12.00  . Pack years: 12.00  . Types: Cigarettes  . Last attempt to quit: 02/06/1998  . Years since quitting: 18.9  Smokeless Tobacco Never Used     Outpatient Encounter Medications as of 01/04/2017  Medication Sig  . ARMOUR THYROID 90 MG tablet Take 1 tablet by mouth daily.  Marland Kitchen liothyronine (CYTOMEL) 5 MCG tablet Take 5 mcg by mouth Daily.  . naltrexone (DEPADE) 50 MG tablet Take 50 mg by mouth daily.  . progesterone (PROMETRIUM) 100 MG capsule Take by mouth daily. As directed  . spironolactone (ALDACTONE) 50 MG tablet Take 1 tablet by mouth daily.  . [DISCONTINUED] thyroid (NP THYROID) 120 MG tablet Take 120 mg by mouth daily before breakfast.  . ARMOUR THYROID 120 MG tablet Take 1 tablet by mouth daily.  . [DISCONTINUED] amoxicillin-clavulanate (AUGMENTIN) 875-125 MG tablet Take 1 tablet by mouth 2 (two) times daily.  . [DISCONTINUED] azithromycin (ZITHROMAX) 250 MG tablet 2 tabs day one, 1 tab days 2-5  . [DISCONTINUED] TIROSINT 150 MCG CAPS Take 1 capsule by mouth daily.   No facility-administered encounter medications on file as of 01/04/2017.     Allergies: Nitrous oxide; Percocet [oxycodone-acetaminophen]; and Percodan [oxycodone-aspirin]  Body mass index is 33.99 kg/m.  Blood pressure 104/73, pulse 76, height 5' 6.5" (1.689 m), weight 213 lb 12.8 oz (97 kg), last menstrual period 11/22/2016.   Review of Systems  Constitutional: Negative for activity change, appetite change, chills, diaphoresis, fever and unexpected weight change.  HENT: Negative for congestion, postnasal drip and rhinorrhea.   Eyes: Negative for visual disturbance.  Respiratory: Positive for cough. Negative for chest tightness, shortness of breath, wheezing and stridor.   Cardiovascular: Negative for chest pain and leg swelling.  Gastrointestinal: Negative for abdominal distention, abdominal pain, blood in stool, constipation, diarrhea, nausea and vomiting.  Endocrine:  Negative for cold intolerance, heat intolerance, polydipsia, polyphagia and polyuria.  Genitourinary: Negative for difficulty urinating, dysuria, flank pain and hematuria.  Musculoskeletal: Negative for arthralgias, back pain, gait problem, joint swelling, myalgias, neck pain and neck stiffness.  Skin: Negative for color change, pallor, rash and wound.  Neurological: Negative for dizziness, tremors, weakness and headaches.  Hematological: Does not bruise/bleed easily.  Psychiatric/Behavioral: Negative for decreased concentration, dysphoric mood, hallucinations, self-injury, sleep disturbance and suicidal ideas. The patient is not nervous/anxious and is not hyperactive.        Objective:   Physical Exam  Constitutional: She is oriented to person, place, and time. She appears well-developed and well-nourished. No distress.  HENT:  Head: Normocephalic and atraumatic.  Right Ear: Hearing, tympanic membrane, external ear and ear canal normal. Tympanic membrane is not erythematous and not bulging. No decreased hearing is noted.  Left Ear: Hearing, tympanic membrane, external ear and ear canal normal. Tympanic membrane is not erythematous and not bulging. No decreased  hearing is noted.  Nose: Nose normal. No mucosal edema. Right sinus exhibits no maxillary sinus tenderness and no frontal sinus tenderness. Left sinus exhibits no maxillary sinus tenderness and no frontal sinus tenderness.  Mouth/Throat: Uvula is midline, oropharynx is clear and moist and mucous membranes are normal.  Eyes: Conjunctivae are normal. Pupils are equal, round, and reactive to light.  Neck: Normal range of motion. Neck supple.  Cardiovascular: Normal rate, regular rhythm and normal heart sounds.  No murmur heard. Pulmonary/Chest: Effort normal and breath sounds normal. No respiratory distress. She has no decreased breath sounds. She has no wheezes. She has no rhonchi. She has no rales. She exhibits no tenderness. Right  breast exhibits no inverted nipple, no mass, no nipple discharge, no skin change and no tenderness. Left breast exhibits no inverted nipple, no mass, no nipple discharge, no skin change and no tenderness. Breasts are symmetrical.  Occsational, non-productive cough noted during examination.  Abdominal: Soft. Bowel sounds are normal. She exhibits no distension and no mass. There is no tenderness. There is no rebound and no guarding.  Musculoskeletal: Normal range of motion. She exhibits no edema or tenderness.  Lymphadenopathy:    She has no cervical adenopathy.  Neurological: She is alert and oriented to person, place, and time.  Skin: Skin is warm and dry. No rash noted. She is not diaphoretic. No erythema. No pallor.  Psychiatric: She has a normal mood and affect. Her behavior is normal. Judgment and thought content normal.  Nursing note and vitals reviewed.     Assessment & Plan:   1. Need for Tdap vaccination   2. Persistent cough   3. Hypothyroidism due to Hashimoto's thyroiditis   4. Healthcare maintenance     Persistent cough CXR- negative Increase water, continue to abstain from tobacco  Healthcare maintenance Increase water intake, strive for at least 110 ounces/day, REALLY try to completely eliminate the soda. Follow Heart Healthy diet Increase regular exercise.  Recommend at least 30 minutes daily, 5 days per week of walking, jogging, biking, swimming, YouTube/Pinterest workout videos. We will call you when your lab results are available.  Annual follow-up, sooner if needed.  Hypothyroidism due to Hashimoto's thyroiditis Followed by Endocrinology every 3 months Currently taking Liothyronine (Cytomel) 5mcg, Armour 120mg  (am), Armour (90mg ) daily TSH obtained today     FOLLOW-UP:  Return in about 1 year (around 01/04/2018) for CPE.

## 2017-01-03 ENCOUNTER — Other Ambulatory Visit: Payer: Self-pay

## 2017-01-03 DIAGNOSIS — E063 Autoimmune thyroiditis: Principal | ICD-10-CM

## 2017-01-03 DIAGNOSIS — E038 Other specified hypothyroidism: Secondary | ICD-10-CM

## 2017-01-04 ENCOUNTER — Encounter: Payer: Self-pay | Admitting: Adult Health

## 2017-01-04 ENCOUNTER — Ambulatory Visit (INDEPENDENT_AMBULATORY_CARE_PROVIDER_SITE_OTHER): Payer: BC Managed Care – PPO | Admitting: Adult Health

## 2017-01-04 ENCOUNTER — Ambulatory Visit: Payer: BC Managed Care – PPO

## 2017-01-04 VITALS — BP 104/73 | HR 76 | Ht 66.5 in | Wt 213.8 lb

## 2017-01-04 DIAGNOSIS — E038 Other specified hypothyroidism: Secondary | ICD-10-CM

## 2017-01-04 DIAGNOSIS — R05 Cough: Secondary | ICD-10-CM

## 2017-01-04 DIAGNOSIS — Z0000 Encounter for general adult medical examination without abnormal findings: Secondary | ICD-10-CM

## 2017-01-04 DIAGNOSIS — R053 Chronic cough: Secondary | ICD-10-CM | POA: Insufficient documentation

## 2017-01-04 DIAGNOSIS — Z23 Encounter for immunization: Secondary | ICD-10-CM | POA: Diagnosis not present

## 2017-01-04 DIAGNOSIS — E063 Autoimmune thyroiditis: Secondary | ICD-10-CM

## 2017-01-04 NOTE — Assessment & Plan Note (Signed)
Followed by Endocrinology every 3 months Currently taking Liothyronine (Cytomel) 5mcg, Armour 120mg  (am), Armour (90mg ) daily TSH obtained today

## 2017-01-04 NOTE — Assessment & Plan Note (Signed)
Increase water intake, strive for at least 110 ounces/day, REALLY try to completely eliminate the soda. Follow Heart Healthy diet Increase regular exercise.  Recommend at least 30 minutes daily, 5 days per week of walking, jogging, biking, swimming, YouTube/Pinterest workout videos. We will call you when your lab results are available.  Annual follow-up, sooner if needed.

## 2017-01-04 NOTE — Patient Instructions (Addendum)
Heart-Healthy Eating Plan Many factors influence your heart health, including eating and exercise habits. Heart (coronary) risk increases with abnormal blood fat (lipid) levels. Heart-healthy meal planning includes limiting unhealthy fats, increasing healthy fats, and making other small dietary changes. This includes maintaining a healthy body weight to help keep lipid levels within a normal range. What is my plan? Your health care provider recommends that you:  Get no more than ___25___% of the total calories in your daily diet from fat.  Limit your intake of saturated fat to less than ____5___% of your total calories each day.  Limit the amount of cholesterol in your diet to less than ___300___ mg per day.  What types of fat should I choose?  Choose healthy fats more often. Choose monounsaturated and polyunsaturated fats, such as olive oil and canola oil, flaxseeds, walnuts, almonds, and seeds.  Eat more omega-3 fats. Good choices include salmon, mackerel, sardines, tuna, flaxseed oil, and ground flaxseeds. Aim to eat fish at least two times each week.  Limit saturated fats. Saturated fats are primarily found in animal products, such as meats, butter, and cream. Plant sources of saturated fats include palm oil, palm kernel oil, and coconut oil.  Avoid foods with partially hydrogenated oils in them. These contain trans fats. Examples of foods that contain trans fats are stick margarine, some tub margarines, cookies, crackers, and other baked goods. What general guidelines do I need to follow?  Check food labels carefully to identify foods with trans fats or high amounts of saturated fat.  Fill one half of your plate with vegetables and green salads. Eat 4-5 servings of vegetables per day. A serving of vegetables equals 1 cup of raw leafy vegetables,  cup of raw or cooked cut-up vegetables, or  cup of vegetable juice.  Fill one fourth of your plate with whole grains. Look for the word  "whole" as the first word in the ingredient list.  Fill one fourth of your plate with lean protein foods.  Eat 4-5 servings of fruit per day. A serving of fruit equals one medium whole fruit,  cup of dried fruit,  cup of fresh, frozen, or canned fruit, or  cup of 100% fruit juice.  Eat more foods that contain soluble fiber. Examples of foods that contain this type of fiber are apples, broccoli, carrots, beans, peas, and barley. Aim to get 20-30 g of fiber per day.  Eat more home-cooked food and less restaurant, buffet, and fast food.  Limit or avoid alcohol.  Limit foods that are high in starch and sugar.  Avoid fried foods.  Cook foods by using methods other than frying. Baking, boiling, grilling, and broiling are all great options. Other fat-reducing suggestions include: ? Removing the skin from poultry. ? Removing all visible fats from meats. ? Skimming the fat off of stews, soups, and gravies before serving them. ? Steaming vegetables in water or broth.  Lose weight if you are overweight. Losing just 5-10% of your initial body weight can help your overall health and prevent diseases such as diabetes and heart disease.  Increase your consumption of nuts, legumes, and seeds to 4-5 servings per week. One serving of dried beans or legumes equals  cup after being cooked, one serving of nuts equals 1 ounces, and one serving of seeds equals  ounce or 1 tablespoon.  You may need to monitor your salt (sodium) intake, especially if you have high blood pressure. Talk with your health care provider or dietitian to get  more information about reducing sodium. What foods can I eat? Grains  Breads, including French, white, pita, wheat, raisin, rye, oatmeal, and Italian. Tortillas that are neither fried nor made with lard or trans fat. Low-fat rolls, including hotdog and hamburger buns and English muffins. Biscuits. Muffins. Waffles. Pancakes. Light popcorn. Whole-grain cereals. Flatbread.  Melba toast. Pretzels. Breadsticks. Rusks. Low-fat snacks and crackers, including oyster, saltine, matzo, graham, animal, and rye. Rice and pasta, including brown rice and those that are made with whole wheat. Vegetables All vegetables. Fruits All fruits, but limit coconut. Meats and Other Protein Sources Lean, well-trimmed beef, veal, pork, and lamb. Chicken and turkey without skin. All fish and shellfish. Wild duck, rabbit, pheasant, and venison. Egg whites or low-cholesterol egg substitutes. Dried beans, peas, lentils, and tofu.Seeds and most nuts. Dairy Low-fat or nonfat cheeses, including ricotta, string, and mozzarella. Skim or 1% milk that is liquid, powdered, or evaporated. Buttermilk that is made with low-fat milk. Nonfat or low-fat yogurt. Beverages Mineral water. Diet carbonated beverages. Sweets and Desserts Sherbets and fruit ices. Honey, jam, marmalade, jelly, and syrups. Meringues and gelatins. Pure sugar candy, such as hard candy, jelly beans, gumdrops, mints, marshmallows, and small amounts of dark chocolate. Angel food cake. Eat all sweets and desserts in moderation. Fats and Oils Nonhydrogenated (trans-free) margarines. Vegetable oils, including soybean, sesame, sunflower, olive, peanut, safflower, corn, canola, and cottonseed. Salad dressings or mayonnaise that are made with a vegetable oil. Limit added fats and oils that you use for cooking, baking, salads, and as spreads. Other Cocoa powder. Coffee and tea. All seasonings and condiments. The items listed above may not be a complete list of recommended foods or beverages. Contact your dietitian for more options. What foods are not recommended? Grains Breads that are made with saturated or trans fats, oils, or whole milk. Croissants. Butter rolls. Cheese breads. Sweet rolls. Donuts. Buttered popcorn. Chow mein noodles. High-fat crackers, such as cheese or butter crackers. Meats and Other Protein Sources Fatty meats, such  as hotdogs, short ribs, sausage, spareribs, bacon, ribeye roast or steak, and mutton. High-fat deli meats, such as salami and bologna. Caviar. Domestic duck and goose. Organ meats, such as kidney, liver, sweetbreads, brains, gizzard, chitterlings, and heart. Dairy Cream, sour cream, cream cheese, and creamed cottage cheese. Whole milk cheeses, including blue (bleu), Monterey Jack, Brie, [place], American, Havarti, Swiss, cheddar, Camembert, and [place]. Whole or 2% milk that is liquid, evaporated, or condensed. Whole buttermilk. Cream sauce or high-fat cheese sauce. Yogurt that is made from whole milk. Beverages Regular sodas and drinks with added sugar. Sweets and Desserts Frosting. Pudding. Cookies. Cakes other than angel food cake. Candy that has milk chocolate or white chocolate, hydrogenated fat, butter, coconut, or unknown ingredients. Buttered syrups. Full-fat ice cream or ice cream drinks. Fats and Oils Gravy that has suet, meat fat, or shortening. Cocoa butter, hydrogenated oils, palm oil, coconut oil, palm kernel oil. These can often be found in baked products, candy, fried foods, nondairy creamers, and whipped toppings. Solid fats and shortenings, including bacon fat, salt pork, lard, and butter. Nondairy cream substitutes, such as coffee creamers and sour cream substitutes. Salad dressings that are made of unknown oils, cheese, or sour cream. The items listed above may not be a complete list of foods and beverages to avoid. Contact your dietitian for more information. This information is not intended to replace advice given to you by your health care provider. Make sure you discuss any questions you have with your health care   provider. Document Released: 11/02/2007 Document Revised: 08/13/2015 Document Reviewed: 07/17/2013 Elsevier Interactive Patient Education  2017 [employer] Inc.   Cough, Adult A cough helps to clear your throat and lungs. A cough may last only 2-3 weeks (acute), or it  may last longer than 8 weeks (chronic). Many different things can cause a cough. A cough may be a sign of an illness or another medical condition. Follow these instructions at home:  Pay attention to any changes in your cough.  Take medicines only as told by your doctor. ? If you were prescribed an antibiotic medicine, take it as told by your doctor. Do not stop taking it even if you start to feel better. ? Talk with your doctor before you try using a cough medicine.  Drink enough fluid to keep your pee (urine) clear or pale yellow.  If the air is dry, use a cold steam vaporizer or humidifier in your home.  Stay away from things that make you cough at work or at home.  If your cough is worse at night, try using extra pillows to raise your head up higher while you sleep.  Do not smoke, and try not to be around smoke. If you need help quitting, ask your doctor.  Do not have caffeine.  Do not drink alcohol.  Rest as needed. Contact a doctor if:  You have new problems (symptoms).  You cough up yellow fluid (pus).  Your cough does not get better after 2-3 weeks, or your cough gets worse.  Medicine does not help your cough and you are not sleeping well.  You have pain that gets worse or pain that is not helped with medicine.  You have a fever.  You are losing weight and you do not know why.  You have night sweats. Get help right away if:  You cough up blood.  You have trouble breathing.  Your heartbeat is very fast. This information is not intended to replace advice given to you by your health care provider. Make sure you discuss any questions you have with your health care provider. Document Released: 10/06/2010 Document Revised: 07/01/2015 Document Reviewed: 04/01/2014 Elsevier Interactive Patient Education  2018 [employer]  Increase water intake, strive for at least 110 ounces/day, REALLY try to completely eliminate the soda. Follow Heart Healthy diet Increase  regular exercise.  Recommend at least 30 minutes daily, 5 days per week of walking, jogging, biking, swimming, YouTube/Pinterest workout videos. We will call you when your lab results are available.  Annual follow-up, sooner if needed. NICE TO SEE YOU!

## 2017-01-04 NOTE — Assessment & Plan Note (Addendum)
CXR- negative Increase water, continue to abstain from tobacco

## 2017-01-05 LAB — LIPID PANEL
CHOLESTEROL TOTAL: 236 mg/dL — AB (ref 100–199)
Chol/HDL Ratio: 3.8 ratio (ref 0.0–4.4)
HDL: 62 mg/dL (ref >39)
LDL CALC: 153 mg/dL — AB (ref 0–99)
TRIGLYCERIDES: 103 mg/dL (ref 0–149)
VLDL Cholesterol Cal: 21 mg/dL (ref 5–40)

## 2017-01-05 LAB — CBC WITH DIFFERENTIAL/PLATELET
BASOS: 0 %
Basophils Absolute: 0 10*3/uL (ref 0.0–0.2)
EOS (ABSOLUTE): 0.4 10*3/uL (ref 0.0–0.4)
EOS: 6 %
HEMATOCRIT: 44.7 % (ref 34.0–46.6)
HEMOGLOBIN: 15.4 g/dL (ref 11.1–15.9)
Immature Grans (Abs): 0 10*3/uL (ref 0.0–0.1)
Immature Granulocytes: 0 %
LYMPHS ABS: 1.6 10*3/uL (ref 0.7–3.1)
Lymphs: 23 %
MCH: 29.8 pg (ref 26.6–33.0)
MCHC: 34.5 g/dL (ref 31.5–35.7)
MCV: 87 fL (ref 79–97)
MONOCYTES: 9 %
Monocytes Absolute: 0.6 10*3/uL (ref 0.1–0.9)
NEUTROS ABS: 4.1 10*3/uL (ref 1.4–7.0)
Neutrophils: 62 %
Platelets: 373 10*3/uL (ref 150–379)
RBC: 5.16 x10E6/uL (ref 3.77–5.28)
RDW: 12.8 % (ref 12.3–15.4)
WBC: 6.7 10*3/uL (ref 3.4–10.8)

## 2017-01-05 LAB — TSH: TSH: 0.163 u[IU]/mL — AB (ref 0.450–4.500)

## 2017-01-05 LAB — COMPREHENSIVE METABOLIC PANEL
ALBUMIN: 4.7 g/dL (ref 3.5–5.5)
ALT: 24 IU/L (ref 0–32)
AST: 20 IU/L (ref 0–40)
Albumin/Globulin Ratio: 1.8 (ref 1.2–2.2)
Alkaline Phosphatase: 71 IU/L (ref 39–117)
BUN / CREAT RATIO: 15 (ref 9–23)
BUN: 11 mg/dL (ref 6–24)
Bilirubin Total: 0.6 mg/dL (ref 0.0–1.2)
CO2: 24 mmol/L (ref 20–29)
CREATININE: 0.71 mg/dL (ref 0.57–1.00)
Calcium: 9.9 mg/dL (ref 8.7–10.2)
Chloride: 102 mmol/L (ref 96–106)
GFR calc non Af Amer: 100 mL/min/{1.73_m2} (ref >59)
GFR, EST AFRICAN AMERICAN: 116 mL/min/{1.73_m2} (ref >59)
GLUCOSE: 97 mg/dL (ref 65–99)
Globulin, Total: 2.6 g/dL (ref 1.5–4.5)
Potassium: 4.7 mmol/L (ref 3.5–5.2)
Sodium: 139 mmol/L (ref 134–144)
TOTAL PROTEIN: 7.3 g/dL (ref 6.0–8.5)

## 2017-01-05 LAB — HEMOGLOBIN A1C
Est. average glucose Bld gHb Est-mCnc: 108 mg/dL
Hgb A1c MFr Bld: 5.4 % (ref 4.8–5.6)

## 2017-01-05 LAB — VITAMIN D 25 HYDROXY (VIT D DEFICIENCY, FRACTURES): Vit D, 25-Hydroxy: 43.9 ng/mL (ref 30.0–100.0)

## 2017-01-24 LAB — HM PAP SMEAR: HM Pap smear: NEGATIVE

## 2017-03-15 ENCOUNTER — Other Ambulatory Visit: Payer: Self-pay | Admitting: Adult Health

## 2017-03-15 MED ORDER — OSELTAMIVIR PHOSPHATE 75 MG PO CAPS: 75.0000 mg | ORAL_CAPSULE | Freq: Every day | ORAL | 0 refills | Status: DC

## 2017-03-15 NOTE — Progress Notes (Signed)
Husband tested positive Flu Tamiflu provided to prevent transmission

## 2018-01-01 ENCOUNTER — Other Ambulatory Visit: Payer: BC Managed Care – PPO

## 2018-01-04 NOTE — Progress Notes (Deleted)
Subjective:    Patient ID: Sherri Mullins, female    DOB: [birth year], 52 y.o.   MRN: [id number]  HPI:  10/04/16 OV: Ms. Sherri Mullins presents to establish as a new pt.  She is a very pleasant 52 year old female. PMH:  PCOS, Hypothyroidism, hx of positive Antiphospholid antibidy-full work up with Hemat/Onc 11/08/11, results negative. She was under great deal of stress in 2012 and experienced brief period of aphasia.  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 She was struggling with extreme fatigue and body aches and could "not get my thyroid levels under control" then she found a "holistic" endocrinologist and he changed her thyroid medications and additionally dx'd her with lyme disease (vector was likely a mosquito since she cannot recall ever having been bitten by a tick). She stopped smoking 2000 and follows "a clean diet, however I do drink soda and I love sugar". She walks 2-3 miles a day and exercises as local gym several days a week. She has one acute complaint:  Non-productive cough, intermittent wheezing, and clear nasal congestion that have been occurring >6 weeks.  01/04/17 OV: Ms. Sherri Mullins is here for CPE.   She has URI in Late August/Early Sept, she failed Azithromycin then placed on Augmentin.  She was still having the a "ghost cough" when she saw her Endocrinologist and he ran "pnuemonia blood panel and showed I had pneumonia".  She was given course of Minocycline and completed last week, however still having an intermittent non-productive cough.  She denies fever/night sweats/chills/malaise/poor appetite. She cooks/eats clean and has been unable to exercise recently due "this cough".   Her Endocrinologist changed her thyroid medications, now currently Liothyronine (Cytomel) 5mcg daily, Armour 120mg  each am, and Armour 90 each afternoon.  She reports reduction in fatigue with the recent change. She  passed another kidney stone in Oct- followed by Urologist.  We discussed that she should only drink water and completely eliminate soda, which will improve kidney fx.   01/07/18 OV: Ms. Sherri Mullins is here for CPE  Healthcare Maintenance: PAP- Mammogram- Colonoscopy- Immunizations-  Patient Care Team    Relationship Specialty Notifications Start End  William Hamburgeranford, Radiance Deady D, NP PCP - General Family Medicine  10/04/16   Jonnie KindSaunders, Weston W, MD Attending Physician Family Medicine  11/20/14   Ginette [place], [facility]    10/04/16     Patient Active Problem List   Diagnosis Date Noted  . Persistent cough 01/04/2017  . Bronchitis, acute 10/04/2016  . Healthcare maintenance 10/04/2016  . Hypothyroidism due to Hashimoto's thyroiditis 11/20/2014  . Menopausal and perimenopausal disorder 11/20/2014  . BMI 34.0-34.9,adult 11/20/2014  . Antiphospholipid antibody positive      Past Medical History:  Diagnosis Date  . Antiphospholipid antibody positive   . Arthritis   . Generalized edema   . Hyperlipidemia   . Hypothyroid   . Lumbar back pain    with steroid injection  (in 2009  . Lyme disease   . 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 PCOS (polycystic ovarian syndrome)   . TIA (transient ischemic attack) 05/2010   ?  work; pressure; aphasia.  No documented stroke.   . Vitamin D deficiency      Past Surgical History:  Procedure Laterality Date  . CESAREAN SECTION  2002  . epidural steroid injection       Family History  Problem Relation Age of Onset  . Cancer Maternal Aunt 60       breast   . Cancer Maternal Grandmother 60       breast  . Stroke Maternal Grandfather   . Diabetes Mother   . Hyperlipidemia Mother   . Stroke Mother   . Hypertension Father   . Healthy Brother   . Healthy Brother      Social History   Substance and Sexual Activity  Drug Use No     Social History   Substance and Sexual Activity  Alcohol Use Yes   . Alcohol/week: 0.0 standard drinks   Comment: very little     Social History   Tobacco Use  Smoking Status Former Smoker  . Packs/day: 1.00  . Years: 12.00  . Pack years: 12.00  . Types: Cigarettes  . Last attempt to quit: 02/06/1998  . Years since quitting: 19.9  Smokeless Tobacco Never Used     Outpatient Encounter Medications as of 01/07/2018  Medication Sig  . ARMOUR THYROID 120 MG tablet Take 1 tablet by mouth daily.  Mack Guise THYROID 90 MG tablet Take 1 tablet by mouth daily.  Marland Kitchen liothyronine (CYTOMEL) 5 MCG tablet Take 5 mcg by mouth Daily.  . naltrexone (DEPADE) 50 MG tablet Take 50 mg by mouth daily.  Marland Kitchen oseltamivir (TAMIFLU) 75 MG capsule Take 1 capsule (75 mg total) by mouth daily.  . progesterone (PROMETRIUM) 100 MG capsule Take by mouth daily. As directed  . spironolactone (ALDACTONE) 50 MG tablet Take 1 tablet by mouth daily.   No facility-administered encounter medications on file as of 01/07/2018.     Allergies: Nitrous oxide; Percocet [oxycodone-acetaminophen]; and Percodan [oxycodone-aspirin]  There is no height or weight on file to calculate BMI.  There were no vitals taken for this visit.   Review of Systems  Constitutional: Negative for activity change, appetite change, chills, diaphoresis, fever and unexpected weight change.  HENT: Negative for congestion, postnasal drip and rhinorrhea.   Eyes: Negative for visual disturbance.  Respiratory: Positive for cough. Negative for chest tightness, shortness of breath, wheezing and stridor.   Cardiovascular: Negative for chest pain and leg swelling.  Gastrointestinal: Negative for abdominal distention, abdominal pain, blood in stool, constipation, diarrhea, nausea and vomiting.  Endocrine: Negative for cold intolerance, heat intolerance, polydipsia, polyphagia and polyuria.  Genitourinary: Negative for difficulty urinating, dysuria, flank pain and hematuria.  Musculoskeletal: Negative for arthralgias, back  pain, gait problem, joint swelling, myalgias, neck pain and neck stiffness.  Skin: Negative for color change, pallor, rash and wound.  Neurological: Negative for dizziness, tremors, weakness and headaches.  Hematological: Does not bruise/bleed easily.  Psychiatric/Behavioral: Negative for decreased concentration, dysphoric mood, hallucinations, self-injury, sleep disturbance and suicidal ideas. The patient is not nervous/anxious and is not hyperactive.        Objective:   Physical Exam  Constitutional: She is oriented to person, place, and time. She appears well-developed and well-nourished. No distress.  HENT:  Head: Normocephalic and atraumatic.  Right Ear: Hearing, tympanic membrane, external ear and ear canal normal. Tympanic membrane is not erythematous and not bulging. No decreased hearing is noted.  Left Ear: Hearing, tympanic membrane, external ear and ear canal normal. Tympanic membrane is not erythematous and not bulging. No decreased hearing is noted.  Nose: Nose normal. No mucosal edema. Right sinus exhibits no maxillary sinus tenderness and no frontal sinus tenderness. Left sinus exhibits no maxillary sinus tenderness and no frontal sinus tenderness.  Mouth/Throat: Uvula  is midline, oropharynx is clear and moist and mucous membranes are normal.  Eyes: Pupils are equal, round, and reactive to light. Conjunctivae are normal.  Neck: Normal range of motion. Neck supple.  Cardiovascular: Normal rate, regular rhythm and normal heart sounds.  No murmur heard. Pulmonary/Chest: Effort normal and breath sounds normal. No respiratory distress. She has no decreased breath sounds. She has no wheezes. She has no rhonchi. She has no rales. She exhibits no tenderness. Right breast exhibits no inverted nipple, no mass, no nipple discharge, no skin change and no tenderness. Left breast exhibits no inverted nipple, no mass, no nipple discharge, no skin change and no tenderness. Breasts are  symmetrical.  Occsational, non-productive cough noted during examination.  Abdominal: Soft. Bowel sounds are normal. She exhibits no distension and no mass. There is no tenderness. There is no rebound and no guarding.  Musculoskeletal: Normal range of motion. She exhibits no edema or tenderness.  Lymphadenopathy:    She has no cervical adenopathy.  Neurological: She is alert and oriented to person, place, and time.  Skin: Skin is warm and dry. No rash noted. She is not diaphoretic. No erythema. No pallor.  Psychiatric: She has a normal mood and affect. Her behavior is normal. Judgment and thought content normal.  Nursing note and vitals reviewed.     Assessment & Plan:   No diagnosis found.  No problem-specific Assessment & Plan notes found for this encounter.    FOLLOW-UP:  No follow-ups on file.

## 2018-01-06 ENCOUNTER — Encounter: Payer: Self-pay | Admitting: Adult Health

## 2018-01-07 ENCOUNTER — Encounter: Payer: BC Managed Care – PPO | Admitting: Adult Health

## 2019-06-10 ENCOUNTER — Ambulatory Visit: Payer: BC Managed Care – PPO | Attending: Internal Medicine

## 2019-06-10 DIAGNOSIS — Z23 Encounter for immunization: Secondary | ICD-10-CM

## 2019-06-10 NOTE — Progress Notes (Signed)
   Covid-19 Vaccination Clinic  Name:  Caidence Kaseman    MRN: [id number] DOB: 10-29-1965  06/10/2019  Ms. Hardwick was observed post Covid-19 immunization for 15 minutes without incident. She was provided with Vaccine Information Sheet and instruction to access the V-Safe system.   Ms. Doolan was instructed to call 911 with any severe reactions post vaccine: Marland Kitchen Difficulty breathing  . Swelling of face and throat  . A fast heartbeat  . A bad rash all over body  . Dizziness and weakness   Immunizations Administered    Name Date Dose VIS Date Route   Pfizer COVID-19 Vaccine 06/10/2019  1:33 PM 0.3 mL 04/02/2018 Intramuscular   Manufacturer: [employer], [employer]   Lot: [id number]   NDC: [address]-1      Covid-19 Vaccination Clinic  Name:  Milissa Fesperman    MRN: [id number] DOB: [birth year]  06/10/2019  Ms. Schoch was observed post Covid-19 immunization for 15 minutes without incident. She was provided with Vaccine Information Sheet and instruction to access the V-Safe system.   Ms. Behrle was instructed to call 911 with any severe reactions post vaccine: Marland Kitchen Difficulty breathing  . Swelling of face and throat  . A fast heartbeat  . A bad rash all over body  . Dizziness and weakness   Immunizations Administered    Name Date Dose VIS Date Route   Pfizer COVID-19 Vaccine 06/10/2019  1:33 PM 0.3 mL 04/02/2018 Intramuscular   Manufacturer: [employer], [employer]   Lot: [id number]   NDC: [address]-3

## 2019-07-08 ENCOUNTER — Ambulatory Visit: Payer: BC Managed Care – PPO | Attending: Internal Medicine

## 2019-07-08 DIAGNOSIS — Z23 Encounter for immunization: Secondary | ICD-10-CM

## 2019-07-08 NOTE — Progress Notes (Signed)
° °  Covid-19 Vaccination Clinic  Name:  Sherri Mullins    MRN: [id number] DOB: 1965-06-25  07/08/2019  Sherri Mullins was observed post Covid-19 immunization for 15 minutes without incident. She was provided with Vaccine Information Sheet and instruction to access the V-Safe system.   Sherri Mullins was instructed to call 911 with any severe reactions post vaccine:  Difficulty breathing   Swelling of face and throat   A fast heartbeat   A bad rash all over body   Dizziness and weakness   Immunizations Administered    Name Date Dose VIS Date Route   Pfizer COVID-19 Vaccine 07/08/2019 12:30 PM 0.3 mL 04/02/2018 Intramuscular   Manufacturer: [employer], [employer]   Lot: [id number]   NDC: [address]-7

## 2019-08-25 ENCOUNTER — Other Ambulatory Visit: Payer: Self-pay | Admitting: Obstetrics and Gynecology

## 2019-08-25 DIAGNOSIS — R928 Other abnormal and inconclusive findings on diagnostic imaging of breast: Secondary | ICD-10-CM

## 2019-09-03 ENCOUNTER — Ambulatory Visit
Admission: RE | Admit: 2019-09-03 | Discharge: 2019-09-03 | Disposition: A | Discharge status: Home / Self Care | Payer: BC Managed Care – PPO | Source: Ambulatory Visit | Attending: Obstetrics and Gynecology | Admitting: Obstetrics and Gynecology

## 2019-09-03 ENCOUNTER — Other Ambulatory Visit: Payer: Self-pay

## 2019-09-03 DIAGNOSIS — R928 Other abnormal and inconclusive findings on diagnostic imaging of breast: Secondary | ICD-10-CM

## 2020-07-09 ENCOUNTER — Emergency Department (HOSPITAL_COMMUNITY)
Admission: EM | Admit: 2020-07-09 | Discharge: 2020-07-09 | Disposition: A | Discharge status: Home / Self Care | Payer: 59 | Attending: Emergency Medicine | Admitting: Emergency Medicine

## 2020-07-09 ENCOUNTER — Other Ambulatory Visit: Payer: Self-pay

## 2020-07-09 ENCOUNTER — Emergency Department (HOSPITAL_COMMUNITY): Payer: 59

## 2020-07-09 ENCOUNTER — Telehealth: Payer: Self-pay | Admitting: Physician Assistant

## 2020-07-09 ENCOUNTER — Ambulatory Visit
Admission: EM | Admit: 2020-07-09 | Discharge: 2020-07-09 | Disposition: A | Discharge status: Home / Self Care | Payer: 59

## 2020-07-09 ENCOUNTER — Telehealth: Payer: Self-pay

## 2020-07-09 ENCOUNTER — Encounter (HOSPITAL_COMMUNITY): Payer: Self-pay

## 2020-07-09 DIAGNOSIS — Z87891 Personal history of nicotine dependence: Secondary | ICD-10-CM | POA: Insufficient documentation

## 2020-07-09 DIAGNOSIS — R200 Anesthesia of skin: Secondary | ICD-10-CM | POA: Diagnosis present

## 2020-07-09 DIAGNOSIS — R079 Chest pain, unspecified: Secondary | ICD-10-CM | POA: Diagnosis not present

## 2020-07-09 DIAGNOSIS — R2990 Unspecified symptoms and signs involving the nervous system: Secondary | ICD-10-CM

## 2020-07-09 DIAGNOSIS — E039 Hypothyroidism, unspecified: Secondary | ICD-10-CM | POA: Insufficient documentation

## 2020-07-09 DIAGNOSIS — Z1589 Genetic susceptibility to other disease: Secondary | ICD-10-CM

## 2020-07-09 DIAGNOSIS — R0789 Other chest pain: Secondary | ICD-10-CM

## 2020-07-09 DIAGNOSIS — R42 Dizziness and giddiness: Secondary | ICD-10-CM

## 2020-07-09 DIAGNOSIS — Z8673 Personal history of transient ischemic attack (TIA), and cerebral infarction without residual deficits: Secondary | ICD-10-CM

## 2020-07-09 DIAGNOSIS — E785 Hyperlipidemia, unspecified: Secondary | ICD-10-CM

## 2020-07-09 LAB — CBC
HCT: 46.7 % — ABNORMAL HIGH (ref 36.0–46.0)
Hemoglobin: 15.2 g/dL — ABNORMAL HIGH (ref 12.0–15.0)
MCH: 29.7 pg (ref 26.0–34.0)
MCHC: 32.5 g/dL (ref 30.0–36.0)
MCV: 91.4 fL (ref 80.0–100.0)
Platelets: 389 10*3/uL (ref 150–400)
RBC: 5.11 MIL/uL (ref 3.87–5.11)
RDW: 12.1 % (ref 11.5–15.5)
WBC: 8.4 10*3/uL (ref 4.0–10.5)
nRBC: 0 % (ref 0.0–0.2)

## 2020-07-09 LAB — TROPONIN I (HIGH SENSITIVITY)
Troponin I (High Sensitivity): <2 ng/L (ref <18)
Troponin I (High Sensitivity): <2 ng/L (ref <18)

## 2020-07-09 LAB — BASIC METABOLIC PANEL
Anion gap: 4 — ABNORMAL LOW (ref 5–15)
BUN: 13 mg/dL (ref 6–20)
CO2: 27 mmol/L (ref 22–32)
Calcium: 9.2 mg/dL (ref 8.9–10.3)
Chloride: 106 mmol/L (ref 98–111)
Creatinine, Ser: 0.55 mg/dL (ref 0.44–1.00)
GFR, Estimated: >60 mL/min (ref >60)
Glucose, Bld: 94 mg/dL (ref 70–99)
Potassium: 3.7 mmol/L (ref 3.5–5.1)
Sodium: 137 mmol/L (ref 135–145)

## 2020-07-09 LAB — I-STAT BETA HCG BLOOD, ED (MC, WL, AP ONLY): I-stat hCG, quantitative: <5 m[IU]/mL (ref <5)

## 2020-07-09 NOTE — Telephone Encounter (Signed)
error 

## 2020-07-09 NOTE — Discharge Instructions (Addendum)
-  You are having strokelike symptoms.  It is possible that you are having an acute stroke.  If untreated, this can lead to death and permanent dysfunction.  I recommend going to the hospital via ambulance.  If your husband drives you, make sure that he drives the vehicle the entire time.  If symptoms change or worsen, stop and call 911 immediately.

## 2020-07-09 NOTE — ED Provider Notes (Signed)
[facility]-EMERGENCY DEPT Provider Note   CSN: [id number] Arrival date & time: 07/09/20  1617     History Chief Complaint  Patient presents with  . arm numbness    Sherri Mullins is a 55 y.o. female.  Patient has multiple complaints.  She states she intermittently gets arm numbness.  She will wake up in the melanite her arm will be numb but will slowly come back to normal.  She intermittently feels foggy headed.  She intermittently has trouble getting her words right.  She states she suffers from chronic Lyme disease chronic fatigue and she has been dealing with a lot of stress at work.  In terms of been intermittently going on for months.  She has not taken any medicine for that she has not seen any providers for this.  She also comments on intermittent chest pressure.  Chest pressure is not exertional she denies diaphoresis or nausea with it no radiation.        Past Medical History:  Diagnosis Date  . Antiphospholipid antibody positive   . Arthritis   . Generalized edema   . Hyperlipidemia   . Hypothyroid   . Lumbar back pain    with steroid injection  (in 2009  . Lyme disease   . 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 PCOS (polycystic ovarian syndrome)   . TIA (transient ischemic attack) 05/2010   ?  work; pressure; aphasia.  No documented stroke.   . Vitamin D deficiency     Patient Active Problem List   Diagnosis Date Noted  . Persistent cough 01/04/2017  . Bronchitis, acute 10/04/2016  . Healthcare maintenance 10/04/2016  . Hypothyroidism due to Hashimoto's thyroiditis 11/20/2014  . Menopausal and perimenopausal disorder 11/20/2014  . BMI 34.0-34.9,adult 11/20/2014  . Antiphospholipid antibody positive     Past Surgical History:  Procedure Laterality Date  . CESAREAN SECTION  2002  . epidural steroid injection       OB History   No obstetric history on file.     Family History  Problem Relation Age  of Onset  . Cancer Maternal Aunt 60       breast   . Cancer Maternal Grandmother 60       breast  . Stroke Maternal Grandfather   . Diabetes Mother   . Hyperlipidemia Mother   . Stroke Mother   . Hypertension Father   . Healthy Brother   . Healthy Brother     Social History   Tobacco Use  . Smoking status: Former Smoker    Packs/day: 1.00    Years: 12.00    Pack years: 12.00    Types: Cigarettes    Quit date: 02/06/1998    Years since quitting: 22.4  . Smokeless tobacco: Never Used  Vaping Use  . Vaping Use: Never used  Substance Use Topics  . Alcohol use: Yes    Alcohol/week: 0.0 standard drinks    Comment: very little  . Drug use: No    Home Medications Prior to Admission medications   Medication Sig Start Date End Date Taking? Authorizing Provider  ALPRAZolam (XANAX) 0.25 MG tablet alprazolam 0.25 mg tablet    [provider]  ARMOUR THYROID 120 MG tablet Take 1 tablet by mouth daily. 12/04/16   [provider]  ARMOUR THYROID 90 MG tablet Take 1 tablet by mouth daily. 12/27/16   [provider]  estradiol (ESTRACE) 1 MG tablet Take 1 mg by  mouth daily. 01/20/20   [provider]  liothyronine (CYTOMEL) 5 MCG tablet Take 5 mcg by mouth Daily. 09/29/11   [provider]  naltrexone (DEPADE) 50 MG tablet Take 50 mg by mouth daily.    [provider]  oseltamivir (TAMIFLU) 75 MG capsule Take 1 capsule (75 mg total) by mouth daily. 03/15/17   Danford, Orpha Bur D, NP  Phentermine HCl (PHENTRIDE PO) Take by mouth.    [provider]  progesterone (PROMETRIUM) 100 MG capsule Take by mouth daily. As directed    [provider]  spironolactone (ALDACTONE) 50 MG tablet Take 1 tablet by mouth daily. 09/27/16   [provider]    Allergies    Eggs or egg-derived products, Nitrous oxide, Percocet [oxycodone-acetaminophen], and Percodan [oxycodone-aspirin]  Review of Systems   Review of Systems   Constitutional: Negative for chills and fever.  HENT: Negative for congestion and rhinorrhea.   Respiratory: Negative for cough and shortness of breath.   Cardiovascular: Positive for chest pain. Negative for palpitations.  Gastrointestinal: Negative for diarrhea, nausea and vomiting.  Genitourinary: Negative for difficulty urinating and dysuria.  Musculoskeletal: Positive for neck pain. Negative for arthralgias and back pain.  Skin: Negative for rash and wound.  Neurological: Positive for dizziness, speech difficulty and numbness. Negative for light-headedness and headaches.    Physical Exam Updated Vital Signs BP (!) 149/104   Pulse (!) 59   Temp 98.3 F (36.8 C) (Oral)   Resp 13   Ht 5\' 7"  (1.702 m)   SpO2 99%   BMI 33.49 kg/m   Physical Exam Vitals and nursing note reviewed. Exam conducted with a chaperone present.  Constitutional:      General: She is not in acute distress.    Appearance: Normal appearance.  HENT:     Head: Normocephalic and atraumatic.     Nose: No rhinorrhea.  Eyes:     General:        Right eye: No discharge.        Left eye: No discharge.     Conjunctiva/sclera: Conjunctivae normal.  Cardiovascular:     Rate and Rhythm: Normal rate and regular rhythm.  Pulmonary:     Effort: Pulmonary effort is normal. No respiratory distress.     Breath sounds: No stridor. No wheezing or rales.  Abdominal:     General: Abdomen is flat. There is no distension.     Palpations: Abdomen is soft.  Musculoskeletal:        General: No tenderness or signs of injury.     Comments: Normal range of motion in all extremities.  Neurovascular intact in all extremities.  Normal range of motion of the neck.  No midline tenderness.  Skin:    General: Skin is warm and dry.  Neurological:     General: No focal deficit present.     Mental Status: She is alert. Mental status is at baseline.     Motor: No weakness.     Comments: 5 out of 5 motor strength in all extremities,  sensation intact throughout, no dysmetria, no dysdiadochokinesia, no ataxia with ambulation, cranial nerves II through XII intact, alert and oriented to person place and time   Psychiatric:        Mood and Affect: Mood normal.        Behavior: Behavior normal.     ED Results / Procedures / Treatments   Labs (all labs ordered are listed, but only abnormal results are displayed) Labs  Reviewed  BASIC METABOLIC PANEL - Abnormal; Notable for the following components:      Result Value   Anion gap 4 (*)    All other components within normal limits  CBC - Abnormal; Notable for the following components:   Hemoglobin 15.2 (*)    HCT 46.7 (*)    All other components within normal limits  I-STAT BETA HCG BLOOD, ED ([facility], [facility], [facility] ONLY)  TROPONIN I (HIGH SENSITIVITY)  TROPONIN I (HIGH SENSITIVITY)    EKG EKG Interpretation  Date/Time:  Friday July 09 2020 16:24:58 EDT Ventricular Rate:  72 PR Interval:  173 QRS Duration: 95 QT Interval:  395 QTC Calculation: 433 R Axis:   57 Text Interpretation: Sinus rhythm Abnormal R-wave progression, early transition Baseline wander in lead(s) I III aVL V5 12 Lead; Mason-Likar Confirmed by Cherlynn Perches ([id number]) on 07/09/2020 8:21:35 PM   Radiology DG Chest 2 View  Result Date: 07/09/2020 CLINICAL DATA:  Chest pain.  Intermittent right arm numbness. EXAM: CHEST - 2 VIEW COMPARISON:  01/04/2017 FINDINGS: The heart size and mediastinal contours are within normal limits. Both lungs are clear. The visualized skeletal structures are unremarkable. IMPRESSION: No active cardiopulmonary disease. Electronically Signed   By: Paulina Fusi M.D.   On: 07/09/2020 17:11   CT Head Wo Contrast  Result Date: 07/09/2020 CLINICAL DATA:  Intermittent right arm numbness and dizziness. EXAM: CT HEAD WITHOUT CONTRAST TECHNIQUE: Contiguous axial images were obtained from the base of the skull through the vertex without intravenous contrast. COMPARISON:  None. FINDINGS: Brain: No  abnormality affects the brainstem or cerebellum. Cerebral hemispheres appear normal. Small perivascular spaces at the base of the brain. No mass, hemorrhage, hydrocephalus or extra-axial collection. Vascular: No abnormal vascular finding. Skull: Normal Sinuses/Orbits: Ordinary mild mucosal disease of the paranasal sinuses. Orbits appear normal. Other: No fluid in the middle ears or mastoids. IMPRESSION: Normal head CT. Ordinary mucosal inflammatory changes of the paranasal sinuses. Electronically Signed   By: Paulina Fusi M.D.   On: 07/09/2020 17:12    Procedures Procedures   Medications Ordered in ED Medications - No data to display  ED Course  I have reviewed the triage vital signs and the nursing notes.  Pertinent labs & imaging results that were available during my care of the patient were reviewed by me and considered in my medical decision making (see chart for details).    MDM Rules/Calculators/A&P                          Well-appearing.  Normal neurologic exam.  Chronic intermittent symptoms.  Do not fit any specific strokelike pattern.  Low suspicion of TIA or central nervous system pathology.  Imaging reviewed by myself and radiology shows no acute changes.  Laboratory studies unremarkable.  EKG reviewed by myself shows no acute ischemic change interval abnormality or arrhythmia.  Not sure the origin of her symptoms however I do not feel she needs any further emergent work-up.  Outpatient follow-up with neurology. Final Clinical Impression(s) / ED Diagnoses Final diagnoses:  Numbness    Rx / DC Orders ED Discharge Orders    None       Sabino Donovan, MD 07/09/20 2051

## 2020-07-09 NOTE — ED Triage Notes (Addendum)
Patient reports that [facility] just [facility] and was told she could possibly be having a stroke.  Patient c/o intermittent right arm numbness x 1 week. Patient states she has had episodes of confusion and not being able to get her words out (a week ago). Patient states she woke yesterday with nausea.  Patient also reports chest pressure twice in the past 2-3 days.  Patient has a history of TIA and a blood clotting disorder.

## 2020-07-09 NOTE — Discharge Instructions (Addendum)
Call to make an appointment with a neurologist for further evaluation of your symptoms.  Return to [place] with any concerning changes.

## 2020-07-09 NOTE — ED Provider Notes (Signed)
Emergency Medicine Provider Triage Evaluation Note  Sherri Mullins , a 55 y.o. female  was evaluated in triage.  Pt complains of stroke like sxs.  Review of Systems  Positive: Dizzy, R arm numbness, difficulty with word finding Negative: Weakness, cold sxs  Physical Exam  BP (!) 150/99 (BP Location: Right Arm)   Pulse 72   Temp 98.3 F (36.8 C) (Oral)   Resp 16   Ht 5\' 7"  (1.702 m)   SpO2 99%   BMI 33.49 kg/m  Gen:   Awake, no distress   Resp:  Normal effort  MSK:   Moves extremities without difficulty  Other:    Medical Decision Making  Medically screening exam initiated at 4:33 PM.  Appropriate orders placed.  Sherri Mullins was informed that the remainder of the evaluation will be completed by another provider, this initial triage assessment does not replace that evaluation, and the importance of remaining in the ED until their evaluation is complete.  Pt here with stroke like sxs including R arm numbness and dizziness with difficulty with word finding. sxs nearly a month, worse within the past week.  Extreme fatigue, having neck pain.   Sent here from [facility].  Has MTHFR which predispose to blood clot per pt.    AVITA ONTARIO, PA-C 07/09/20 1640    09/08/20, MD 07/09/20 2242

## 2020-07-09 NOTE — Telephone Encounter (Signed)
Patient called our office complaining of fatigue, R arm numbness, dizziness, chest pressure radiating through to her back and neck pain.   Patient is unable to take her blood pressure.   Advised patient to call 911 and have EMS take patient to ED for evaluation and treatment. Patient verbalized understanding and was agreeable.  AS, CMA

## 2020-07-09 NOTE — ED Provider Notes (Signed)
[facility]    CSN: [id number] Arrival date & time: 07/09/20  1421      History   Chief Complaint Chief Complaint  Patient presents with  . Fatigue  . Numbness    Right arm   . Chest Pain    HPI Sherri Mullins is a 55 y.o. female presenting with dizziness, right arm numbness, chest pain, unable to form words at times- symptoms getting worse.  Medical history hyperlipidemia, hypothyroid, Lyme disease, TIA, antiphospholipid antibody positive, "clotting disorder".  Endorses 1 month of progressively worsening fatigue, dizziness, numbness and tingling in her right arm, chest pain that radiates from the center of her chest to her back, nausea without vomiting.  States that the dizziness in particular is getting worse, describes this as lightheadedness that occurs when she goes from sitting to standing and passes after a few minutes.  Describes the chest pain as sharp.  States that she spoke with her primary care this morning who told her to call 911.  She states that instead, she decided to drive herself to this urgent care.  States she has been dealing with more stress lately, as well as menopause, so she attributes her symptoms to this.  She does have history of a TIA.  States that her symptoms are minimal at the time of this visit. Denies weakness in arms or legs, left-sided chest pain, jaw pain. Denies URI symptoms.  HPI  Past Medical History:  Diagnosis Date  . Antiphospholipid antibody positive   . Arthritis   . Generalized edema   . Hyperlipidemia   . Hypothyroid   . Lumbar back pain    with steroid injection  (in 2009  . Lyme disease   . 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 PCOS (polycystic ovarian syndrome)   . TIA (transient ischemic attack) 05/2010   ?  work; pressure; aphasia.  No documented stroke.   . Vitamin D deficiency     Patient Active Problem List   Diagnosis Date Noted  . Persistent cough 01/04/2017  . Bronchitis,  acute 10/04/2016  . Healthcare maintenance 10/04/2016  . Hypothyroidism due to Hashimoto's thyroiditis 11/20/2014  . Menopausal and perimenopausal disorder 11/20/2014  . BMI 34.0-34.9,adult 11/20/2014  . Antiphospholipid antibody positive     Past Surgical History:  Procedure Laterality Date  . CESAREAN SECTION  2002  . epidural steroid injection      OB History   No obstetric history on file.      Home Medications    Prior to Admission medications   Medication Sig Start Date End Date Taking? Authorizing Provider  Phentermine HCl (PHENTRIDE PO) Take by mouth.   Yes [provider]  ALPRAZolam (XANAX) 0.25 MG tablet alprazolam 0.25 mg tablet    [provider]  ARMOUR THYROID 120 MG tablet Take 1 tablet by mouth daily. 12/04/16   [provider]  ARMOUR THYROID 90 MG tablet Take 1 tablet by mouth daily. 12/27/16   [provider]  estradiol (ESTRACE) 1 MG tablet Take 1 mg by mouth daily. 01/20/20   [provider]  liothyronine (CYTOMEL) 5 MCG tablet Take 5 mcg by mouth Daily. 09/29/11   [provider]  naltrexone (DEPADE) 50 MG tablet Take 50 mg by mouth daily.    [provider]  oseltamivir (TAMIFLU) 75 MG capsule Take 1 capsule (75 mg total) by mouth daily. 03/15/17   Danford, Orpha Bur D, NP  progesterone (PROMETRIUM) 100 MG capsule Take  by mouth daily. As directed    [provider]  spironolactone (ALDACTONE) 50 MG tablet Take 1 tablet by mouth daily. 09/27/16   [provider]    Family History Family History  Problem Relation Age of Onset  . Cancer Maternal Aunt 60       breast   . Cancer Maternal Grandmother 60       breast  . Stroke Maternal Grandfather   . Diabetes Mother   . Hyperlipidemia Mother   . Stroke Mother   . Hypertension Father   . Healthy Brother   . Healthy Brother     Social History Social History   Tobacco Use  . Smoking status: Former Smoker    Packs/day: 1.00     Years: 12.00    Pack years: 12.00    Types: Cigarettes    Quit date: 02/06/1998    Years since quitting: 22.4  . Smokeless tobacco: Never Used  Vaping Use  . Vaping Use: Never used  Substance Use Topics  . Alcohol use: Yes    Alcohol/week: 0.0 standard drinks    Comment: very little  . Drug use: No     Allergies   Eggs or egg-derived products, Nitrous oxide, Percocet [oxycodone-acetaminophen], and Percodan [oxycodone-aspirin]   Review of Systems Review of Systems  Respiratory: Positive for chest tightness. Negative for apnea, cough, choking, shortness of breath, wheezing and stridor.   Cardiovascular: Positive for chest pain. Negative for palpitations and leg swelling.  Neurological: Positive for dizziness, speech difficulty and light-headedness. Negative for tremors, seizures, syncope, facial asymmetry, weakness, numbness and headaches.  All other systems reviewed and are negative.    Physical Exam Triage Vital Signs ED Triage Vitals  Enc Vitals Group     BP 07/09/20 1434 136/90     Pulse Rate 07/09/20 1434 89     Resp 07/09/20 1434 18     Temp 07/09/20 1434 98.2 F (36.8 C)     Temp Source 07/09/20 1434 Oral     SpO2 07/09/20 1434 96 %     Weight --      Height --      Head Circumference --      Peak Flow --      Pain Score 07/09/20 1428 0     Pain Loc --      Pain Edu? --      Excl. in GC? --    No data found.  Updated Vital Signs BP 136/90 (BP Location: Right Arm)   Pulse 89   Temp 98.2 F (36.8 C) (Oral)   Resp 18   SpO2 96%   Visual Acuity Right Eye Distance:   Left Eye Distance:   Bilateral Distance:    Right Eye Near:   Left Eye Near:    Bilateral Near:     Physical Exam Vitals reviewed.  Constitutional:      Appearance: Normal appearance. She is not diaphoretic.  HENT:     Head: Normocephalic and atraumatic.     Mouth/Throat:     Mouth: Mucous membranes are moist.  Eyes:     Extraocular Movements: Extraocular movements intact.      Pupils: Pupils are equal, round, and reactive to light.  Cardiovascular:     Rate and Rhythm: Normal rate and regular rhythm.     Pulses:          Radial pulses are 2+ on the right side and 2+ on the left side.  Heart sounds: Normal heart sounds.  Pulmonary:     Effort: Pulmonary effort is normal.     Breath sounds: Normal breath sounds.  Chest:     Comments: Chest pain is not reproducible Abdominal:     Palpations: Abdomen is soft.     Tenderness: There is no abdominal tenderness. There is no guarding or rebound.  Musculoskeletal:     Right lower leg: No edema.     Left lower leg: No edema.     Comments: No spinous or paraspinous muscle tenderness.  Negative Spurling.  Skin:    General: Skin is warm.     Capillary Refill: Capillary refill takes less than 2 seconds.  Neurological:     General: No focal deficit present.     Mental Status: She is alert and oriented to person, place, and time.     Cranial Nerves: Cranial nerves are intact. No cranial nerve deficit.     Sensory: Sensation is intact. No sensory deficit.     Motor: Motor function is intact. No weakness.     Coordination: Coordination is intact. Romberg sign negative.     Gait: Gait is intact. Gait normal.     Comments: AO x 3 Speech normal CN 2-12 grossly intact Negative rhomberg Gait intact Strength 5/5 in UEs and LEs Sensation intact UEs and LEs Normal fingers to thumb  Psychiatric:        Mood and Affect: Mood normal.        Behavior: Behavior normal.        Thought Content: Thought content normal.        Judgment: Judgment normal.      UC Treatments / Results  Labs (all labs ordered are listed, but only abnormal results are displayed) Labs Reviewed - No data to display  EKG   Radiology No results found.  Procedures Procedures (including critical care time)  Medications Ordered in UC Medications - No data to display  Initial Impression / Assessment and Plan / UC Course  I have  reviewed the triage vital signs and the nursing notes.  Pertinent labs & imaging results that were available during my care of the patient were reviewed by me and considered in my medical decision making (see chart for details).     This patient is a 55 year old female presenting with transient strokelike symptoms and central chest pain for 1 month, getting worse.  Was advised by her primary care to call 911, but instead she presented to this urgent care.  Afebrile, nontachycardic. Fairly benign neuro exam.   This patient does have history of TIA in 2012.  Risk factors for CVA/TIA include hyperlipidemia and clotting disorder/MTHFR gene mutation.   EKG today NSR, no prior EKG for comparison.  Radial pulses equal and symmetric, low suspicion for aortic dissection.  Declines transport via EMS multiple times. undestands that she may be having a stroke, and that by declining EMS this could lead to death. States son will drive the vehicle. Nurse observed son arrive and patient getting into vehicle.  Final Clinical Impressions(s) / UC Diagnoses   Final diagnoses:  Stroke-like symptoms  History of transient ischemic attack (TIA)  MTHFR gene mutation  Hyperlipidemia, unspecified hyperlipidemia type  Atypical chest pain  Dizziness     Discharge Instructions     -You are having strokelike symptoms.  It is possible that you are having an acute stroke.  If untreated, this can lead to death and permanent dysfunction.  I recommend going to  the hospital via ambulance.  If your husband drives you, make sure that he drives the vehicle the entire time.  If symptoms change or worsen, stop and call 911 immediately.    ED Prescriptions    None     PDMP not reviewed this encounter.   Rhys MartiniGraham, Kimala Horne E, PA-C 07/09/20 1606

## 2020-07-09 NOTE — ED Notes (Signed)
Patient is being discharged from the Urgent Care and sent to the Emergency Department via POV. Per Cheree Ditto, [place], patient is in need of higher level of care due to stroke like symptoms. Patient is aware and verbalizes understanding of plan of care.  Vitals:   07/09/20 1434  BP: 136/90  Pulse: 89  Resp: 18  Temp: 98.2 F (36.8 C)  SpO2: 96%

## 2020-07-09 NOTE — ED Triage Notes (Addendum)
Patient presents to Urgent Care with complaints of fatigue, dizziness, numbness/tingling in right arm, chest pain that radiates from center of chest  to back, nausea. Pt states these symptoms are on-going for about a month. She states she spoke with PCP who instructed her to call 911. She states she has been dealing with more stress lately so unsure if these symptoms are related to stress or menopause. Pt states only new medication is phentermine in the past 8 months.   Denies vomiting, changes in vision, slurred speech.

## 2020-09-09 ENCOUNTER — Other Ambulatory Visit: Payer: Self-pay

## 2020-09-09 ENCOUNTER — Ambulatory Visit (HOSPITAL_COMMUNITY)
Admission: EM | Admit: 2020-09-09 | Discharge: 2020-09-09 | Disposition: A | Discharge status: Home / Self Care | Payer: BC Managed Care – PPO | Attending: Family Medicine | Admitting: Family Medicine

## 2020-09-09 ENCOUNTER — Encounter (HOSPITAL_COMMUNITY): Payer: Self-pay

## 2020-09-09 DIAGNOSIS — M25531 Pain in right wrist: Secondary | ICD-10-CM

## 2020-09-09 MED ORDER — DICLOFENAC SODIUM 75 MG PO TBEC: 75.0000 mg | DELAYED_RELEASE_TABLET | Freq: Two times a day (BID) | ORAL | 0 refills | Status: DC

## 2020-09-09 MED ORDER — PREDNISONE 20 MG PO TABS
40.0000 mg | ORAL_TABLET | Freq: Every day | ORAL | 0 refills | Status: DC
Start: 2020-09-09 — End: 2023-02-27

## 2020-09-09 MED ORDER — PREDNISONE 20 MG PO TABS: 40.0000 mg | ORAL_TABLET | Freq: Every day | ORAL | 0 refills | Status: DC

## 2020-09-09 NOTE — ED Triage Notes (Signed)
Pt presents with right wrist pain with no known injury since last night.

## 2020-09-09 NOTE — ED Provider Notes (Signed)
[facility]   [id number] 09/09/20 Arrival Time: 1535  ASSESSMENT & PLAN:  1. Right wrist pain    Likely tendonitis. No indication for wrist imaging. Discussed. See AVS for d/c information.  Meds ordered this encounter  Medications   diclofenac (VOLTAREN) 75 MG EC tablet    Sig: Take 1 tablet (75 mg total) by mouth 2 (two) times daily.    Dispense:  14 tablet    Refill:  0   DISCONTD: predniSONE (DELTASONE) 20 MG tablet    Sig: Take 2 tablets (40 mg total) by mouth daily.    Dispense:  10 tablet    Refill:  0   predniSONE (DELTASONE) 20 MG tablet    Sig: Take 2 tablets (40 mg total) by mouth daily.    Dispense:  10 tablet    Refill:  0    Orders Placed This Encounter  Procedures   Apply Wrist brace  Activities as tolerated.  Recommend:  Follow-up Information      [facility]   Why: If worsening or failing to improve as anticipated. Contact information: [address] [place] [address] [phone]                 Reviewed expectations re: course of current medical issues. Questions answered. Outlined signs and symptoms indicating need for more acute intervention. Patient verbalized understanding. After Visit Summary given.  SUBJECTIVE: History from: patient. Sherri Mullins is a 55 y.o. female who reports fairly persistent moderate pain of her right wrist; described as dull with occasional sharp pain; without radiation. Onset: abrupt. First noted: yesterday. Injury/trama: no, but recently moving a lot of furniture; questions relation. Symptoms have progressed to a point and plateaued since beginning. Aggravating factors: certain movements. Alleviating factors: have not been identified. Associated symptoms: none reported. Extremity sensation changes or weakness: none. No tx PTA.  Past Surgical History:  Procedure Laterality Date   CESAREAN SECTION  2002   epidural steroid injection         OBJECTIVE:  Vitals:   09/09/20 1601  BP: 129/85  Pulse: 66  Resp: 18  Temp: 98.2 F (36.8 C)  TempSrc: Oral  SpO2: 94%    General appearance: alert; no distress HEENT: [place]; AT Neck: supple with FROM Resp: unlabored respirations Extremities: RUE: warm with well perfused appearance; poorly localized moderate tenderness over right dorsal wrist; without gross deformities; swelling: mild; bruising: none; wrist ROM: normal, with discomfort CV: brisk extremity capillary refill of RUE; 2+ radial pulse of RUE. 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  work; pressure; aphasia.  No documented stroke.    Vitamin D deficiency    Social History   Socioeconomic History   Marital status: Married  Spouse name: Alfreida Steffenhagen   Number of children: 1   Years of education: 12th grade   Highest education level: Not on file  Occupational History   Occupation: stay at home    Employer: [employer]  Tobacco Use   Smoking status: Former    Packs/day: 1.00    Years: 12.00    Pack years: 12.00    Types: Cigarettes    Quit date: 02/06/1998    Years since quitting: 22.6   Smokeless tobacco: Never  Vaping Use   Vaping Use: Never used  Substance and Sexual Activity   Alcohol use: Yes    Alcohol/week: 0.0 standard drinks     Comment: very little   Drug use: No   Sexual activity: Yes    Birth control/protection: None  Other Topics Concern   Not on file  Social History Narrative   Lives at home with her husband and their son, Sherri Mullins (PJ).   Social Determinants of Health   Financial Resource Strain: Not on file  Food Insecurity: Not on file  Transportation Needs: Not on file  Physical Activity: Not on file  Stress: Not on file  Social Connections: Not on file   Family History  Problem Relation Age of Onset   Cancer Maternal Aunt 26       breast    Cancer Maternal Grandmother 79       breast   Stroke Maternal Grandfather    Diabetes Mother    Hyperlipidemia Mother    Stroke Mother    Hypertension Father    Healthy Brother    Healthy Brother    Past Surgical History:  Procedure Laterality Date   CESAREAN SECTION  2002   epidural steroid injection         Mardella Layman, MD 09/09/20 1757

## 2021-05-10 IMAGING — US US BREAST*L* LIMITED INC AXILLA
1 series · 8 of 8 positions shown · non-contrast
Comparison: Previous exam(s).

CLINICAL DATA: Possible mass in the outer left breast on a recent
screening mammogram.

EXAM:
DIGITAL DIAGNOSTIC LEFT MAMMOGRAM WITH TOMO
ULTRASOUND LEFT BREAST

[Series 1: us breast*left* limited inc axilla · 0.08mm/px · 8 of 8 slices shown]
[im 1/8]
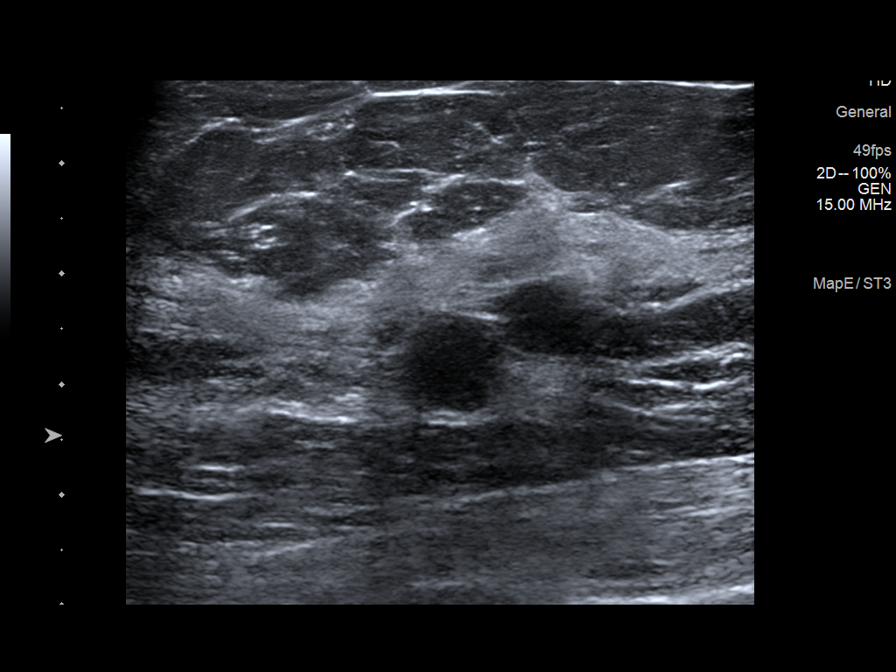
[im 2/8]
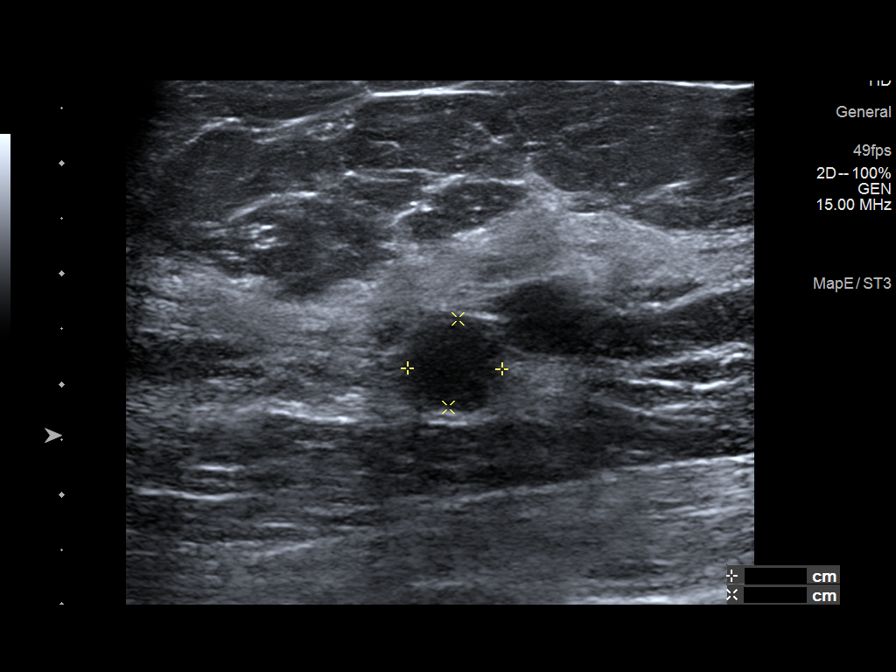
[im 3/8]
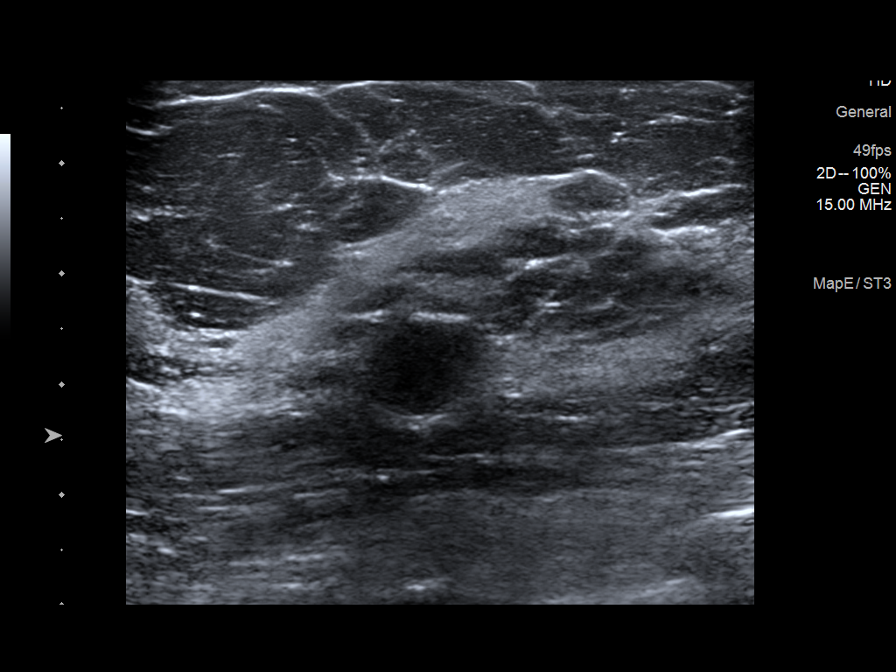
[im 4/8]
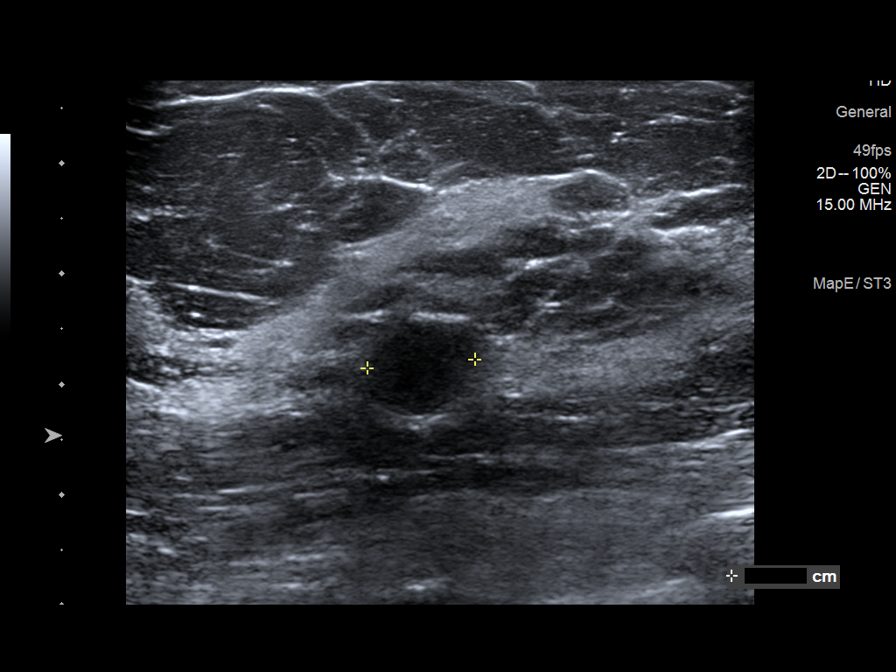
[im 5/8]
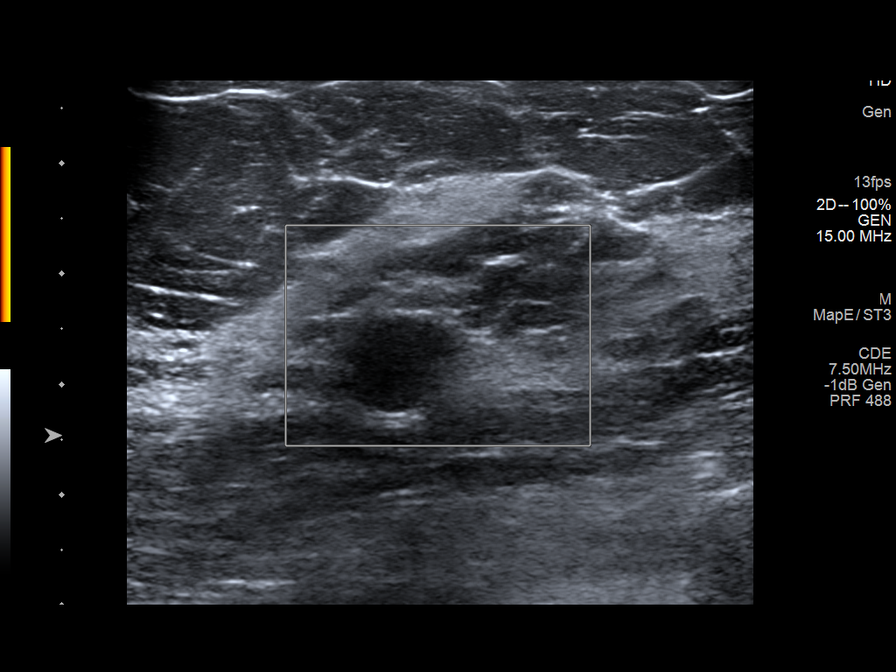
[im 6/8]
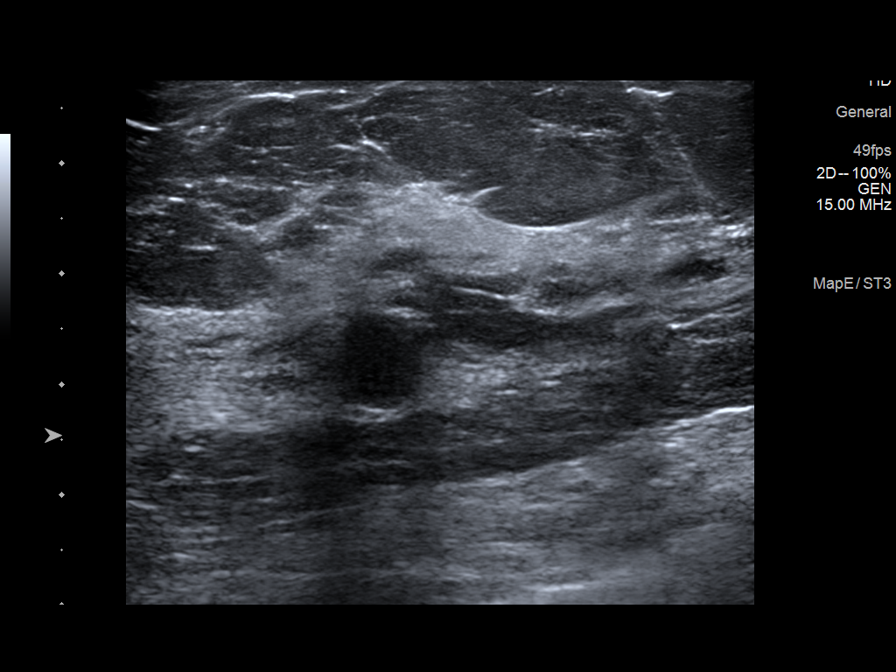
[im 7/8]
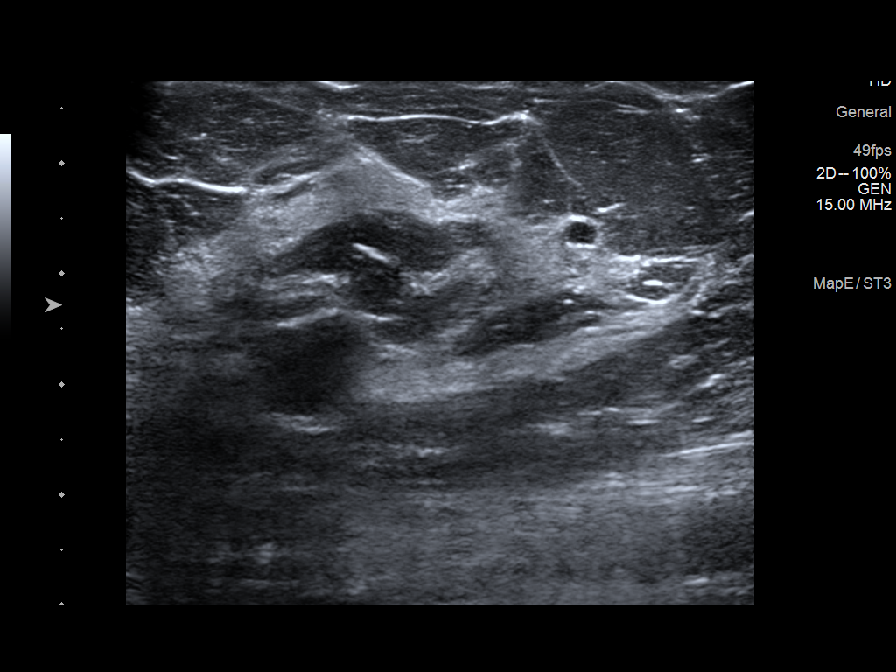
[im 8/8]
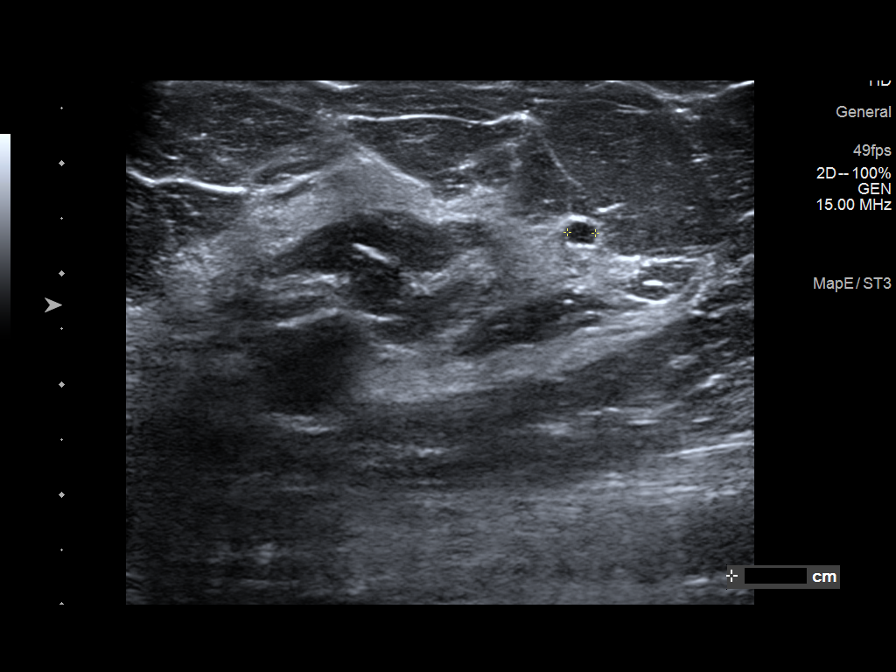

[8 of 8 positions shown; findings below may reference images not displayed]

ACR Breast Density Category c: The breast tissue is heterogeneously
dense, which may obscure small masses.
FINDINGS: 3D tomographic and 2D generated spot compression views of the left
breast confirm an oval, partially circumscribed and partially
obscured mass in the outer aspect of the breast in approximately the
3 o'clock position. This measures 1.2 cm in maximum diameter. In the
craniocaudal projection, this has been mammographically stable over
multiple years. There were multiple cysts in that region of the
breast on the previous MR dated 01/22/2015.

On physical exam, no mass palpable in the outer left breast.

Targeted ultrasound is performed, showing a 1.0 x 0.9 x 0.8 cm
rounded, hypoechoic mass with some circumscribed and some
indistinct/obscured margins. This is in 3 o'clock position, 6 cm
from the nipple and has no internal blood flow with power Doppler.
There were 2 larger cysts in that region on the previous left breast
ultrasound dated 01/13/2010. There is a nearby 0.3 cm oval,
hypoechoic mass compatible with a cyst as well.
IMPRESSION: Benign left breast cysts.  No evidence of malignancy.

RECOMMENDATION:
Bilateral screening mammogram in 1 year.

I have discussed the findings and recommendations with the patient.
If applicable, a reminder letter will be sent to the patient
regarding the next appointment.

BI-RADS CATEGORY  2: Benign.

## 2021-12-15 ENCOUNTER — Other Ambulatory Visit: Payer: Self-pay | Admitting: Obstetrics and Gynecology

## 2021-12-15 DIAGNOSIS — R928 Other abnormal and inconclusive findings on diagnostic imaging of breast: Secondary | ICD-10-CM

## 2022-01-05 ENCOUNTER — Ambulatory Visit
Admission: RE | Admit: 2022-01-05 | Discharge: 2022-01-05 | Disposition: A | Discharge status: Home / Self Care | Payer: BC Managed Care – PPO | Source: Ambulatory Visit | Attending: Obstetrics and Gynecology | Admitting: Obstetrics and Gynecology

## 2022-01-05 ENCOUNTER — Other Ambulatory Visit: Payer: Self-pay | Admitting: Obstetrics and Gynecology

## 2022-01-05 ENCOUNTER — Encounter: Payer: Self-pay | Admitting: Obstetrics and Gynecology

## 2022-01-05 ENCOUNTER — Ambulatory Visit: Payer: BC Managed Care – PPO

## 2022-01-05 DIAGNOSIS — R928 Other abnormal and inconclusive findings on diagnostic imaging of breast: Secondary | ICD-10-CM

## 2022-01-16 ENCOUNTER — Ambulatory Visit
Admission: RE | Admit: 2022-01-16 | Discharge: 2022-01-16 | Disposition: A | Discharge status: Home / Self Care | Payer: BC Managed Care – PPO | Source: Ambulatory Visit | Attending: Obstetrics and Gynecology | Admitting: Obstetrics and Gynecology

## 2022-01-16 ENCOUNTER — Other Ambulatory Visit: Payer: Self-pay | Admitting: Obstetrics and Gynecology

## 2022-01-16 DIAGNOSIS — R928 Other abnormal and inconclusive findings on diagnostic imaging of breast: Secondary | ICD-10-CM

## 2022-01-16 HISTORY — PX: BREAST BIOPSY: SHX20

## 2023-01-02 ENCOUNTER — Other Ambulatory Visit: Payer: Self-pay | Admitting: Obstetrics and Gynecology

## 2023-01-02 DIAGNOSIS — R928 Other abnormal and inconclusive findings on diagnostic imaging of breast: Secondary | ICD-10-CM

## 2023-01-19 ENCOUNTER — Other Ambulatory Visit: Payer: BC Managed Care – PPO

## 2023-01-24 ENCOUNTER — Other Ambulatory Visit: Payer: Self-pay | Admitting: Obstetrics and Gynecology

## 2023-01-24 ENCOUNTER — Ambulatory Visit
Admission: RE | Admit: 2023-01-24 | Discharge: 2023-01-24 | Disposition: A | Discharge status: Home / Self Care | Payer: BC Managed Care – PPO | Source: Ambulatory Visit | Attending: Obstetrics and Gynecology | Admitting: Obstetrics and Gynecology

## 2023-01-24 DIAGNOSIS — R928 Other abnormal and inconclusive findings on diagnostic imaging of breast: Secondary | ICD-10-CM

## 2023-01-24 DIAGNOSIS — N6489 Other specified disorders of breast: Secondary | ICD-10-CM

## 2023-01-26 ENCOUNTER — Ambulatory Visit
Admission: RE | Admit: 2023-01-26 | Discharge: 2023-01-26 | Disposition: A | Discharge status: Home / Self Care | Payer: BC Managed Care – PPO | Source: Ambulatory Visit | Attending: Obstetrics and Gynecology | Admitting: Obstetrics and Gynecology

## 2023-01-26 DIAGNOSIS — R928 Other abnormal and inconclusive findings on diagnostic imaging of breast: Secondary | ICD-10-CM

## 2023-01-26 DIAGNOSIS — N6489 Other specified disorders of breast: Secondary | ICD-10-CM

## 2023-01-26 HISTORY — PX: BREAST BIOPSY: SHX20

## 2023-02-01 LAB — SURGICAL PATHOLOGY

## 2023-02-09 ENCOUNTER — Other Ambulatory Visit: Payer: BC Managed Care – PPO

## 2023-02-16 ENCOUNTER — Ambulatory Visit: Payer: Self-pay | Admitting: Surgery

## 2023-02-16 DIAGNOSIS — N6092 Unspecified benign mammary dysplasia of left breast: Secondary | ICD-10-CM

## 2023-02-16 NOTE — H&P (View-Only) (Signed)
Subjective    Chief Complaint: NEW BREAST       History of Present Illness: Sherri Mullins is a 58 y.o. female who is seen today as an office consultation at the request of Dr. Lelon Perla for evaluation of NEW BREAST .   This is a 58 year old female who presents with recent mammograms revealing asymmetry of the left breast.  She underwent biopsy of this area that revealed atypical lobular hyperplasia.  1 year prior, the patient had 2 other biopsies of the left breast and other areas that were benign.  The patient does not have a family history of breast cancer in first-degree relatives but does have a maternal grandmother who passed away from breast cancer.  She also has 2 maternal aunts who have been diagnosed with breast cancer.   The patient was on hormone supplements but has stopped that since her biopsy.   Review of Systems: A complete review of systems was obtained from the patient.  I have reviewed this information and discussed as appropriate with the patient.  See HPI as well for other ROS.   Review of Systems  Constitutional: Negative.   HENT: Negative.    Eyes: Negative.   Respiratory: Negative.    Cardiovascular: Negative.   Gastrointestinal: Negative.   Genitourinary: Negative.   Musculoskeletal: Negative.   Skin: Negative.   Neurological: Negative.   Endo/Heme/Allergies: Negative.   Psychiatric/Behavioral:  Positive for memory loss. The patient is nervous/anxious.         Medical History: Past Medical History      Past Medical History:  Diagnosis Date   Anxiety     Arthritis     Asthma, unspecified asthma severity, unspecified whether complicated, unspecified whether persistent (HHS-HCC)     DVT (deep venous thrombosis) (CMS/HHS-HCC)     Thyroid disease          Problem List     Patient Active Problem List  Diagnosis   Antiphospholipid antibody positive   Hypothyroidism due to Hashimoto's thyroiditis   Menopausal and perimenopausal disorder   Atypical  lobular hyperplasia of left breast        Past Surgical History       Past Surgical History:  Procedure Laterality Date   CESAREAN SECTION            Allergies      Allergies  Allergen Reactions   Egg Derived Other (See Comments)   Nitrous Oxide Other (See Comments)   Oxycodone-Acetaminophen Itching   Oxycodone-Aspirin Itching        Medications Ordered Prior to Encounter        Current Outpatient Medications on File Prior to Visit  Medication Sig Dispense Refill   ALPRAZolam (XANAX) 0.25 MG tablet alprazolam 0.25 mg tablet       ARMOUR THYROID 120 mg tablet TAKE 1 TABLET BY MOUTH 2 TIMES A DAY BEFORE A MEAL       FLUoxetine (PROZAC) 10 MG capsule Take 10 mg by mouth once daily       levothyroxine (SYNTHROID) 75 MCG tablet Take 75 mcg by mouth every morning        No current facility-administered medications on file prior to visit.        Family History       Family History  Problem Relation Age of Onset   Breast cancer Maternal Aunt     Breast cancer Maternal Grandmother          Tobacco Use History  Social History  Tobacco Use  Smoking Status Former   Types: Cigarettes   Start date: 2001  Smokeless Tobacco Never        Social History  Social History         Socioeconomic History   Marital status: Unknown  Tobacco Use   Smoking status: Former      Types: Cigarettes      Start date: 2001   Smokeless tobacco: Never  Substance and Sexual Activity   Alcohol use: Yes   Drug use: Never    Social Drivers of Health        Housing Stability: Unknown (02/16/2023)    Housing Stability Vital Sign     Homeless in the Last Year: No        Objective:         Vitals:    02/16/23 1002  BP: 138/88  Pulse: 83  Temp: 36.9 C (98.4 F)  SpO2: 98%  Weight: (!) 102.4 kg (225 lb 12.8 oz)  Height: 170.2 cm (5\' 7" )  PainSc: 0-No pain  PainLoc: Breast    Body mass index is 35.37 kg/m.   Physical Exam    Constitutional:  WDWN in NAD,  conversant, no obvious deformities; lying in bed comfortably Eyes:  Pupils equal, round; sclera anicteric; moist conjunctiva; no lid lag HENT:  Oral mucosa moist; good dentition  Neck:  No masses palpated, trachea midline; no thyromegaly Lungs:  CTA bilaterally; normal respiratory effort Breasts:  symmetric, no nipple changes; no palpable masses or lymphadenopathy on either side CV:  Regular rate and rhythm; no murmurs; extremities well-perfused with no edema Abd:  +bowel sounds, soft, non-tender, no palpable organomegaly; no palpable hernias Musc: Normal gait; no apparent clubbing or cyanosis in extremities Lymphatic:  No palpable cervical or axillary lymphadenopathy Skin:  Warm, dry; no sign of jaundice Psychiatric - alert and oriented x 4; calm mood and affect     Labs, Imaging and Diagnostic Testing: FINAL DIAGNOSIS        1. Breast, left, needle core biopsy, outer mid to upper breast focal distortion :       -  LOBULAR NEOPLASIA (ATYPICAL LOBULAR HYPERPLASIA) AND ADENOSIS WITH       CALCIFICATIONS       -  NO MALIGNANCY IDENTIFIED    CLINICAL DATA:  Patient was recalled from screening mammogram for possible distortion in the left breast. Patient has history of 2 benign biopsies in the left breast.   EXAM: DIGITAL DIAGNOSTIC UNILATERAL LEFT MAMMOGRAM WITH TOMOSYNTHESIS AND CAD; ULTRASOUND LEFT BREAST LIMITED   TECHNIQUE: Left digital diagnostic mammography and breast tomosynthesis was performed. The images were evaluated with computer-aided detection. ; Targeted ultrasound examination of the left breast was performed.   COMPARISON:  Previous exam(s).   ACR Breast Density Category c: The breasts are heterogeneously dense, which may obscure small masses.   FINDINGS: Additional imaging of the left breast was performed. There is persistent subtle distortion in the upper-outer quadrant of the left breast best seen on the cc view. There are no malignant  type microcalcifications.   Targeted ultrasound is performed, showing normal tissue in the lateral aspect of the left breast. No mass, distortion or abnormal shadowing detected. Sonographic evaluation of the left axilla does not show any enlarged adenopathy.   IMPRESSION: Persistent subtle distortion in the upper-outer quadrant of the left breast.   RECOMMENDATION: Stereotactic biopsy of distortion in the upper-outer quadrant of the left breast is recommended.   I have discussed the findings  and recommendations with the patient. If applicable, a reminder letter will be sent to the patient regarding the next appointment.   BI-RADS CATEGORY  4: Suspicious.     Electronically Signed   By: Baird Lyons M.D.   On: 01/24/2023 15:58     Assessment and Plan:  Diagnoses and all orders for this visit:   Atypical lobular hyperplasia of left breast     Recommend left breast radioactive seed localized excisional biopsy to excise the area of atypical lobular hyperplasia and to definitively rule out malignancy.The surgical procedure has been discussed with the patient.  Potential risks, benefits, alternative treatments, and expected outcomes have been explained.  All of the patient's questions at this time have been answered.  The likelihood of reaching the patient's treatment goal is good.  The patient understands the proposed surgical procedure and wishes to proceed.     The patient has C density breast and multiple family members with a history of breast cancer.  Consider screening MRI or contrast-enhanced mammography.   Baillie Mohammad Delbert Harness, MD  02/16/2023 11:12 AM

## 2023-02-16 NOTE — H&P (Signed)
 Subjective    Chief Complaint: NEW BREAST       History of Present Illness: Sherri Mullins is a 58 y.o. female who is seen today as an office consultation at the request of Dr. Lelon Perla for evaluation of NEW BREAST .   This is a 58 year old female who presents with recent mammograms revealing asymmetry of the left breast.  She underwent biopsy of this area that revealed atypical lobular hyperplasia.  1 year prior, the patient had 2 other biopsies of the left breast and other areas that were benign.  The patient does not have a family history of breast cancer in first-degree relatives but does have a maternal grandmother who passed away from breast cancer.  She also has 2 maternal aunts who have been diagnosed with breast cancer.   The patient was on hormone supplements but has stopped that since her biopsy.   Review of Systems: A complete review of systems was obtained from the patient.  I have reviewed this information and discussed as appropriate with the patient.  See HPI as well for other ROS.   Review of Systems  Constitutional: Negative.   HENT: Negative.    Eyes: Negative.   Respiratory: Negative.    Cardiovascular: Negative.   Gastrointestinal: Negative.   Genitourinary: Negative.   Musculoskeletal: Negative.   Skin: Negative.   Neurological: Negative.   Endo/Heme/Allergies: Negative.   Psychiatric/Behavioral:  Positive for memory loss. The patient is nervous/anxious.         Medical History: Past Medical History      Past Medical History:  Diagnosis Date   Anxiety     Arthritis     Asthma, unspecified asthma severity, unspecified whether complicated, unspecified whether persistent (HHS-HCC)     DVT (deep venous thrombosis) (CMS/HHS-HCC)     Thyroid disease          Problem List     Patient Active Problem List  Diagnosis   Antiphospholipid antibody positive   Hypothyroidism due to Hashimoto's thyroiditis   Menopausal and perimenopausal disorder   Atypical  lobular hyperplasia of left breast        Past Surgical History       Past Surgical History:  Procedure Laterality Date   CESAREAN SECTION            Allergies      Allergies  Allergen Reactions   Egg Derived Other (See Comments)   Nitrous Oxide Other (See Comments)   Oxycodone-Acetaminophen Itching   Oxycodone-Aspirin Itching        Medications Ordered Prior to Encounter        Current Outpatient Medications on File Prior to Visit  Medication Sig Dispense Refill   ALPRAZolam (XANAX) 0.25 MG tablet alprazolam 0.25 mg tablet       ARMOUR THYROID 120 mg tablet TAKE 1 TABLET BY MOUTH 2 TIMES A DAY BEFORE A MEAL       FLUoxetine (PROZAC) 10 MG capsule Take 10 mg by mouth once daily       levothyroxine (SYNTHROID) 75 MCG tablet Take 75 mcg by mouth every morning        No current facility-administered medications on file prior to visit.        Family History       Family History  Problem Relation Age of Onset   Breast cancer Maternal Aunt     Breast cancer Maternal Grandmother          Tobacco Use History  Social History  Tobacco Use  Smoking Status Former   Types: Cigarettes   Start date: 2001  Smokeless Tobacco Never        Social History  Social History         Socioeconomic History   Marital status: Unknown  Tobacco Use   Smoking status: Former      Types: Cigarettes      Start date: 2001   Smokeless tobacco: Never  Substance and Sexual Activity   Alcohol use: Yes   Drug use: Never    Social Drivers of Health        Housing Stability: Unknown (02/16/2023)    Housing Stability Vital Sign     Homeless in the Last Year: No        Objective:         Vitals:    02/16/23 1002  BP: 138/88  Pulse: 83  Temp: 36.9 C (98.4 F)  SpO2: 98%  Weight: (!) 102.4 kg (225 lb 12.8 oz)  Height: 170.2 cm (5\' 7" )  PainSc: 0-No pain  PainLoc: Breast    Body mass index is 35.37 kg/m.   Physical Exam    Constitutional:  WDWN in NAD,  conversant, no obvious deformities; lying in bed comfortably Eyes:  Pupils equal, round; sclera anicteric; moist conjunctiva; no lid lag HENT:  Oral mucosa moist; good dentition  Neck:  No masses palpated, trachea midline; no thyromegaly Lungs:  CTA bilaterally; normal respiratory effort Breasts:  symmetric, no nipple changes; no palpable masses or lymphadenopathy on either side CV:  Regular rate and rhythm; no murmurs; extremities well-perfused with no edema Abd:  +bowel sounds, soft, non-tender, no palpable organomegaly; no palpable hernias Musc: Normal gait; no apparent clubbing or cyanosis in extremities Lymphatic:  No palpable cervical or axillary lymphadenopathy Skin:  Warm, dry; no sign of jaundice Psychiatric - alert and oriented x 4; calm mood and affect     Labs, Imaging and Diagnostic Testing: FINAL DIAGNOSIS        1. Breast, left, needle core biopsy, outer mid to upper breast focal distortion :       -  LOBULAR NEOPLASIA (ATYPICAL LOBULAR HYPERPLASIA) AND ADENOSIS WITH       CALCIFICATIONS       -  NO MALIGNANCY IDENTIFIED    CLINICAL DATA:  Patient was recalled from screening mammogram for possible distortion in the left breast. Patient has history of 2 benign biopsies in the left breast.   EXAM: DIGITAL DIAGNOSTIC UNILATERAL LEFT MAMMOGRAM WITH TOMOSYNTHESIS AND CAD; ULTRASOUND LEFT BREAST LIMITED   TECHNIQUE: Left digital diagnostic mammography and breast tomosynthesis was performed. The images were evaluated with computer-aided detection. ; Targeted ultrasound examination of the left breast was performed.   COMPARISON:  Previous exam(s).   ACR Breast Density Category c: The breasts are heterogeneously dense, which may obscure small masses.   FINDINGS: Additional imaging of the left breast was performed. There is persistent subtle distortion in the upper-outer quadrant of the left breast best seen on the cc view. There are no malignant  type microcalcifications.   Targeted ultrasound is performed, showing normal tissue in the lateral aspect of the left breast. No mass, distortion or abnormal shadowing detected. Sonographic evaluation of the left axilla does not show any enlarged adenopathy.   IMPRESSION: Persistent subtle distortion in the upper-outer quadrant of the left breast.   RECOMMENDATION: Stereotactic biopsy of distortion in the upper-outer quadrant of the left breast is recommended.   I have discussed the findings  and recommendations with the patient. If applicable, a reminder letter will be sent to the patient regarding the next appointment.   BI-RADS CATEGORY  4: Suspicious.     Electronically Signed   By: Baird Lyons M.D.   On: 01/24/2023 15:58     Assessment and Plan:  Diagnoses and all orders for this visit:   Atypical lobular hyperplasia of left breast     Recommend left breast radioactive seed localized excisional biopsy to excise the area of atypical lobular hyperplasia and to definitively rule out malignancy.The surgical procedure has been discussed with the patient.  Potential risks, benefits, alternative treatments, and expected outcomes have been explained.  All of the patient's questions at this time have been answered.  The likelihood of reaching the patient's treatment goal is good.  The patient understands the proposed surgical procedure and wishes to proceed.     The patient has C density breast and multiple family members with a history of breast cancer.  Consider screening MRI or contrast-enhanced mammography.   Baillie Mohammad Delbert Harness, MD  02/16/2023 11:12 AM

## 2023-02-20 ENCOUNTER — Other Ambulatory Visit: Payer: Self-pay | Admitting: Surgery

## 2023-02-20 DIAGNOSIS — N6092 Unspecified benign mammary dysplasia of left breast: Secondary | ICD-10-CM

## 2023-02-27 ENCOUNTER — Encounter (HOSPITAL_BASED_OUTPATIENT_CLINIC_OR_DEPARTMENT_OTHER): Payer: Self-pay | Admitting: Surgery

## 2023-02-27 NOTE — Anesthesia Preprocedure Evaluation (Addendum)
Anesthesia Evaluation  Patient identified by MRN, date of birth, ID band Patient awake    Reviewed: Allergy & Precautions, NPO status , Patient's Chart, lab work & pertinent test results  History of Anesthesia Complications (+) Family history of anesthesia reaction  Airway Mallampati: II       Dental no notable dental hx. (+) Teeth Intact, Dental Advisory Given   Pulmonary former smoker   Pulmonary exam normal breath sounds clear to auscultation       Cardiovascular negative cardio ROS Normal cardiovascular exam Rhythm:Regular Rate:Normal     Neuro/Psych TIA negative psych ROS   GI/Hepatic negative GI ROS, Neg liver ROS,,,  Endo/Other  Hypothyroidism  Obesity   Renal/GU negative Renal ROS  negative genitourinary   Musculoskeletal  (+) Arthritis , Osteoarthritis,    Abdominal  (+) + obese  Peds  Hematology negative hematology ROS (+) MHTFR mutation   Anesthesia Other Findings   Reproductive/Obstetrics                              Anesthesia Physical Anesthesia Plan  ASA: 2  Anesthesia Plan: General   Post-op Pain Management: Minimal or no pain anticipated, Ofirmev IV (intra-op)*, Precedex and Dilaudid IV   Induction: Intravenous  PONV Risk Score and Plan: 4 or greater and Treatment may vary due to age or medical condition, Midazolam, Ondansetron and Dexamethasone  Airway Management Planned: LMA  Additional Equipment: None  Intra-op Plan:   Post-operative Plan: Extubation in OR  Informed Consent: I have reviewed the patients History and Physical, chart, labs and discussed the procedure including the risks, benefits and alternatives for the proposed anesthesia with the patient or authorized representative who has indicated his/her understanding and acceptance.     Dental advisory given  Plan Discussed with: CRNA and Anesthesiologist  Anesthesia Plan Comments: (PAT  review d/t pt report of MHTFR deficiency, has never has anesthesia before. Will avoid nitrous; propofol and other inhalationals should not be a problem.  Salvadore Farber)       Anesthesia Quick Evaluation

## 2023-03-02 MED ORDER — CHLORHEXIDINE GLUCONATE CLOTH 2 % EX PADS
6.0000 | MEDICATED_PAD | Freq: Once | CUTANEOUS | Status: DC
Start: 2023-03-02 — End: 2023-03-07

## 2023-03-02 MED ORDER — CHLORHEXIDINE GLUCONATE CLOTH 2 % EX PADS: 6.0000 | MEDICATED_PAD | Freq: Once | CUTANEOUS | Status: DC

## 2023-03-02 NOTE — Progress Notes (Signed)

## 2023-03-06 ENCOUNTER — Ambulatory Visit
Admission: RE | Admit: 2023-03-06 | Discharge: 2023-03-06 | Disposition: A | Discharge status: Home / Self Care | Payer: 59 | Source: Ambulatory Visit | Attending: Surgery | Admitting: Surgery

## 2023-03-06 DIAGNOSIS — N6092 Unspecified benign mammary dysplasia of left breast: Secondary | ICD-10-CM

## 2023-03-06 HISTORY — PX: BREAST BIOPSY: SHX20

## 2023-03-07 ENCOUNTER — Encounter (HOSPITAL_BASED_OUTPATIENT_CLINIC_OR_DEPARTMENT_OTHER): Payer: Self-pay | Admitting: Surgery

## 2023-03-07 ENCOUNTER — Other Ambulatory Visit: Payer: Self-pay

## 2023-03-07 ENCOUNTER — Ambulatory Visit (HOSPITAL_BASED_OUTPATIENT_CLINIC_OR_DEPARTMENT_OTHER)
Admission: RE | Admit: 2023-03-07 | Discharge: 2023-03-07 | Disposition: A | Discharge status: Home / Self Care | Payer: 59 | Attending: Surgery | Admitting: Surgery

## 2023-03-07 ENCOUNTER — Ambulatory Visit (HOSPITAL_BASED_OUTPATIENT_CLINIC_OR_DEPARTMENT_OTHER): Payer: 59 | Admitting: Anesthesiology

## 2023-03-07 ENCOUNTER — Encounter (HOSPITAL_BASED_OUTPATIENT_CLINIC_OR_DEPARTMENT_OTHER)
Admission: RE | Disposition: A | Discharge status: Home / Self Care | Payer: Self-pay | Source: Home / Self Care | Attending: Surgery

## 2023-03-07 ENCOUNTER — Ambulatory Visit
Admission: RE | Admit: 2023-03-07 | Discharge: 2023-03-07 | Disposition: A | Discharge status: Home / Self Care | Payer: Self-pay | Source: Ambulatory Visit | Attending: Surgery | Admitting: Surgery

## 2023-03-07 DIAGNOSIS — N6082 Other benign mammary dysplasias of left breast: Secondary | ICD-10-CM | POA: Diagnosis not present

## 2023-03-07 DIAGNOSIS — E669 Obesity, unspecified: Secondary | ICD-10-CM | POA: Diagnosis not present

## 2023-03-07 DIAGNOSIS — Z8673 Personal history of transient ischemic attack (TIA), and cerebral infarction without residual deficits: Secondary | ICD-10-CM | POA: Insufficient documentation

## 2023-03-07 DIAGNOSIS — Z803 Family history of malignant neoplasm of breast: Secondary | ICD-10-CM | POA: Diagnosis not present

## 2023-03-07 DIAGNOSIS — Z87891 Personal history of nicotine dependence: Secondary | ICD-10-CM | POA: Insufficient documentation

## 2023-03-07 DIAGNOSIS — N6022 Fibroadenosis of left breast: Secondary | ICD-10-CM | POA: Insufficient documentation

## 2023-03-07 DIAGNOSIS — E063 Autoimmune thyroiditis: Secondary | ICD-10-CM | POA: Diagnosis not present

## 2023-03-07 DIAGNOSIS — E7212 Methylenetetrahydrofolate reductase deficiency: Secondary | ICD-10-CM | POA: Diagnosis not present

## 2023-03-07 DIAGNOSIS — N62 Hypertrophy of breast: Secondary | ICD-10-CM | POA: Insufficient documentation

## 2023-03-07 DIAGNOSIS — N6092 Unspecified benign mammary dysplasia of left breast: Secondary | ICD-10-CM

## 2023-03-07 DIAGNOSIS — Z01818 Encounter for other preprocedural examination: Secondary | ICD-10-CM

## 2023-03-07 DIAGNOSIS — Z6836 Body mass index (BMI) 36.0-36.9, adult: Secondary | ICD-10-CM | POA: Insufficient documentation

## 2023-03-07 HISTORY — DX: Family history of other specified conditions: Z84.89

## 2023-03-07 HISTORY — PX: RADIOACTIVE SEED GUIDED EXCISIONAL BREAST BIOPSY: SHX6490

## 2023-03-07 SURGERY — RADIOACTIVE SEED GUIDED BREAST BIOPSY
Anesthesia: General | Site: Breast | Laterality: Left

## 2023-03-07 MED ORDER — LIDOCAINE 2% (20 MG/ML) 5 ML SYRINGE
INTRAMUSCULAR | Status: DC | PRN
  Administered 2023-03-07: 80 mg via INTRAVENOUS

## 2023-03-07 MED ORDER — DEXAMETHASONE SODIUM PHOSPHATE 10 MG/ML IJ SOLN
INTRAMUSCULAR | Status: AC
Start: 2023-03-07 — End: ?
  Filled 2023-03-07: qty 1

## 2023-03-07 MED ORDER — FENTANYL CITRATE (PF) 100 MCG/2ML IJ SOLN: 25.0000 ug | INTRAMUSCULAR | Status: DC | PRN

## 2023-03-07 MED ORDER — BUPIVACAINE-EPINEPHRINE 0.25% -1:200000 IJ SOLN
INTRAMUSCULAR | Status: DC | PRN
  Administered 2023-03-07: 10 mL

## 2023-03-07 MED ORDER — MIDAZOLAM HCL 5 MG/5ML IJ SOLN
INTRAMUSCULAR | Status: DC | PRN
  Administered 2023-03-07: 2 mg via INTRAVENOUS

## 2023-03-07 MED ORDER — FENTANYL CITRATE (PF) 100 MCG/2ML IJ SOLN
INTRAMUSCULAR | Status: DC | PRN
  Administered 2023-03-07: 50 ug via INTRAVENOUS
  Administered 2023-03-07 (×2): 25 ug via INTRAVENOUS

## 2023-03-07 MED ORDER — LIDOCAINE 2% (20 MG/ML) 5 ML SYRINGE
INTRAMUSCULAR | Status: AC
  Filled 2023-03-07: qty 5

## 2023-03-07 MED ORDER — HYDROCODONE-ACETAMINOPHEN 5-325 MG PO TABS
1.0000 | ORAL_TABLET | Freq: Once | ORAL | Status: AC
  Administered 2023-03-07: 1 via ORAL

## 2023-03-07 MED ORDER — ONDANSETRON HCL 4 MG/2ML IJ SOLN
INTRAMUSCULAR | Status: AC
Start: 2023-03-07 — End: ?
  Filled 2023-03-07: qty 2

## 2023-03-07 MED ORDER — PROPOFOL 10 MG/ML IV BOLUS
INTRAVENOUS | Status: DC | PRN
  Administered 2023-03-07: 180 mg via INTRAVENOUS

## 2023-03-07 MED ORDER — DEXAMETHASONE SODIUM PHOSPHATE 10 MG/ML IJ SOLN
INTRAMUSCULAR | Status: DC | PRN
  Administered 2023-03-07: 10 mg via INTRAVENOUS

## 2023-03-07 MED ORDER — DROPERIDOL 2.5 MG/ML IJ SOLN: 0.6250 mg | Freq: Once | INTRAMUSCULAR | Status: DC | PRN

## 2023-03-07 MED ORDER — HYDROCODONE-ACETAMINOPHEN 7.5-325 MG PO TABS: 1.0000 | ORAL_TABLET | Freq: Once | ORAL | Status: DC | PRN

## 2023-03-07 MED ORDER — LACTATED RINGERS IV SOLN: INTRAVENOUS | Status: DC

## 2023-03-07 MED ORDER — HYDROCODONE-ACETAMINOPHEN 5-325 MG PO TABS
ORAL_TABLET | ORAL | Status: AC
  Filled 2023-03-07: qty 1

## 2023-03-07 MED ORDER — ONDANSETRON HCL 4 MG/2ML IJ SOLN
INTRAMUSCULAR | Status: DC | PRN
  Administered 2023-03-07: 4 mg via INTRAVENOUS

## 2023-03-07 MED ORDER — CEFAZOLIN SODIUM-DEXTROSE 2-4 GM/100ML-% IV SOLN
2.0000 g | INTRAVENOUS | Status: AC
  Administered 2023-03-07: 2 g via INTRAVENOUS

## 2023-03-07 MED ORDER — MIDAZOLAM HCL 2 MG/2ML IJ SOLN
INTRAMUSCULAR | Status: AC
  Filled 2023-03-07: qty 2

## 2023-03-07 MED ORDER — BUPIVACAINE-EPINEPHRINE (PF) 0.25% -1:200000 IJ SOLN
INTRAMUSCULAR | Status: AC
  Filled 2023-03-07: qty 30

## 2023-03-07 MED ORDER — ACETAMINOPHEN 500 MG PO TABS
ORAL_TABLET | ORAL | Status: AC
  Filled 2023-03-07: qty 2

## 2023-03-07 MED ORDER — ACETAMINOPHEN 500 MG PO TABS
1000.0000 mg | ORAL_TABLET | ORAL | Status: AC
  Administered 2023-03-07: 1000 mg via ORAL

## 2023-03-07 MED ORDER — SODIUM CHLORIDE 0.9 % IV SOLN: INTRAVENOUS | Status: DC | PRN

## 2023-03-07 MED ORDER — ONDANSETRON HCL 4 MG/2ML IJ SOLN: 4.0000 mg | Freq: Once | INTRAMUSCULAR | Status: DC | PRN

## 2023-03-07 MED ORDER — CEFAZOLIN SODIUM-DEXTROSE 2-4 GM/100ML-% IV SOLN
INTRAVENOUS | Status: AC
  Filled 2023-03-07: qty 100

## 2023-03-07 MED ORDER — FENTANYL CITRATE (PF) 100 MCG/2ML IJ SOLN
INTRAMUSCULAR | Status: AC
  Filled 2023-03-07: qty 2

## 2023-03-07 MED ORDER — PROPOFOL 10 MG/ML IV BOLUS
INTRAVENOUS | Status: AC
  Filled 2023-03-07: qty 20

## 2023-03-07 SURGICAL SUPPLY — 38 items
APPLIER CLIP 9.375 MED OPEN (MISCELLANEOUS) IMPLANT
BENZOIN TINCTURE PRP APPL 2/3 (GAUZE/BANDAGES/DRESSINGS) ×1 IMPLANT
BLADE HEX COATED 2.75 (ELECTRODE) ×1 IMPLANT
BLADE SURG 15 STRL LF DISP TIS (BLADE) ×1 IMPLANT
CANISTER SUCT 1200ML W/VALVE (MISCELLANEOUS) ×1 IMPLANT
CHLORAPREP W/TINT 26 (MISCELLANEOUS) ×1 IMPLANT
CLIP APPLIE 9.375 MED OPEN (MISCELLANEOUS) IMPLANT
COVER BACK TABLE 60X90IN (DRAPES) ×1 IMPLANT
COVER MAYO STAND STRL (DRAPES) ×1 IMPLANT
COVER PROBE CYLINDRICAL 5X96 (MISCELLANEOUS) ×1 IMPLANT
DRAPE LAPAROTOMY 100X72 PEDS (DRAPES) ×1 IMPLANT
DRAPE UTILITY XL STRL (DRAPES) ×1 IMPLANT
DRSG TEGADERM 4X4.75 (GAUZE/BANDAGES/DRESSINGS) ×1 IMPLANT
ELECT REM PT RETURN 9FT ADLT (ELECTROSURGICAL) ×1 IMPLANT
ELECTRODE REM PT RTRN 9FT ADLT (ELECTROSURGICAL) ×1 IMPLANT
GAUZE SPONGE 2X2 STRL 8-PLY (GAUZE/BANDAGES/DRESSINGS) IMPLANT
GAUZE SPONGE 4X4 12PLY STRL LF (GAUZE/BANDAGES/DRESSINGS) ×1 IMPLANT
GLOVE BIO SURGEON STRL SZ7 (GLOVE) ×1 IMPLANT
GLOVE BIOGEL PI IND STRL 7.5 (GLOVE) ×1 IMPLANT
GOWN STRL REUS W/ TWL LRG LVL3 (GOWN DISPOSABLE) ×2 IMPLANT
KIT MARKER MARGIN INK (KITS) ×1 IMPLANT
NDL HYPO 25X1 1.5 SAFETY (NEEDLE) ×1 IMPLANT
NEEDLE HYPO 25X1 1.5 SAFETY (NEEDLE) ×1 IMPLANT
NS IRRIG 1000ML POUR BTL (IV SOLUTION) ×1 IMPLANT
PACK BASIN DAY SURGERY FS (CUSTOM PROCEDURE TRAY) ×1 IMPLANT
PENCIL SMOKE EVACUATOR (MISCELLANEOUS) ×1 IMPLANT
SLEEVE SCD COMPRESS KNEE MED (STOCKING) ×1 IMPLANT
SPIKE FLUID TRANSFER (MISCELLANEOUS) IMPLANT
SPONGE T-LAP 4X18 ~~LOC~~+RFID (SPONGE) ×1 IMPLANT
STRIP CLOSURE SKIN 1/2X4 (GAUZE/BANDAGES/DRESSINGS) ×1 IMPLANT
SUT MON AB 4-0 PC3 18 (SUTURE) ×1 IMPLANT
SUT SILK 2 0 SH (SUTURE) IMPLANT
SUT VIC AB 3-0 SH 27X BRD (SUTURE) ×1 IMPLANT
SYR CONTROL 10ML LL (SYRINGE) ×1 IMPLANT
TOWEL GREEN STERILE FF (TOWEL DISPOSABLE) ×1 IMPLANT
TRAY FAXITRON CT DISP (TRAY / TRAY PROCEDURE) ×1 IMPLANT
TUBE CONNECTING 20X1/4 (TUBING) ×1 IMPLANT
YANKAUER SUCT BULB TIP NO VENT (SUCTIONS) ×1 IMPLANT

## 2023-03-07 NOTE — Interval H&P Note (Signed)
History and Physical Interval Note:  03/07/2023 8:14 AM  Sherri Mullins  has presented today for surgery, with the diagnosis of LEFT BREAST ATYPICAL LOBULAR HYPERPLASIA.  The various methods of treatment have been discussed with the patient and family. After consideration of risks, benefits and other options for treatment, the patient has consented to  Procedure(s) with comments: LEFT BREAST RADIOACTIVE SEED LOCALIZED EXCISIONAL BIOPSY (Left) - LMA as a surgical intervention.  The patient's history has been reviewed, patient examined, no change in status, stable for surgery.  I have reviewed the patient's chart and labs.  Questions were answered to the patient's satisfaction.     Wynona Luna

## 2023-03-07 NOTE — Discharge Instructions (Addendum)
[facility] Office Phone Number [phone]  BREAST BIOPSY/ PARTIAL MASTECTOMY: POST OP INSTRUCTIONS  Always review your discharge instruction sheet given to you by the facility where your surgery was performed.  IF YOU HAVE DISABILITY OR FAMILY LEAVE FORMS, YOU MUST BRING THEM TO THE OFFICE FOR PROCESSING.  DO NOT GIVE THEM TO YOUR DOCTOR.  A prescription for pain medication may be given to you upon discharge.  Take your pain medication as prescribed, if needed.  If narcotic pain medicine is not needed, then you may take acetaminophen (Tylenol) or ibuprofen (Advil) as needed. Take your usually prescribed medications unless otherwise directed If you need a refill on your pain medication, please contact your pharmacy.  They will contact our office to request authorization.  Prescriptions will not be filled after 5pm or on week-ends. You should eat very light the first 24 hours after surgery, such as soup, crackers, pudding, etc.  Resume your normal diet the day after surgery. Most patients will experience some swelling and bruising in the breast.  Ice packs and a good support bra will help.  Swelling and bruising can take several days to resolve.  It is common to experience some constipation if taking pain medication after surgery.  Increasing fluid intake and taking a stool softener will usually help or prevent this problem from occurring.  A mild laxative (Milk of Magnesia or Miralax) should be taken according to package directions if there are no bowel movements after 48 hours. Unless discharge instructions indicate otherwise, you may remove your bandages 24-48 hours after surgery, and you may shower at that time.  You may have steri-strips (small skin tapes) in place directly over the incision.  These strips should be left on the skin for 7-10 days.  If your surgeon used skin glue on the incision, you may shower in 24 hours.  The glue will flake off over the next 2-3 weeks.  Any  sutures or staples will be removed at the office during your follow-up visit. ACTIVITIES:  You may resume regular daily activities (gradually increasing) beginning the next day.  Wearing a good support bra or sports bra minimizes pain and swelling.  You may have sexual intercourse when it is comfortable. You may drive when you no longer are taking prescription pain medication, you can comfortably wear a seatbelt, and you can safely maneuver your car and apply brakes. RETURN TO WORK:  ______________________________________________________________________________________ Sherri Mullins should see your doctor in the office for a follow-up appointment approximately two weeks after your surgery.  Your doctor's nurse will typically make your follow-up appointment when she calls you with your pathology report.  Expect your pathology report 2-3 business days after your surgery.  You may call to check if you do not hear from [place] after three days. OTHER INSTRUCTIONS: _______________________________________________________________________________________________ _____________________________________________________________________________________________________________________________________ _____________________________________________________________________________________________________________________________________ _____________________________________________________________________________________________________________________________________  WHEN TO CALL YOUR DOCTOR: Fever over 101.0 Nausea and/or vomiting. Extreme swelling or bruising. Continued bleeding from incision. Increased pain, redness, or drainage from the incision.  The clinic staff is available to answer your questions during regular business hours.  Please don't hesitate to call and ask to speak to one of the nurses for clinical concerns.  If you have a medical emergency, go to the nearest emergency room or call 911.  A surgeon from [facility] is always on call at the hospital.  For further questions, please visit [email or web address]      Post Anesthesia Home Care Instructions  Activity: Get plenty of rest for the  remainder of the day. A responsible individual must stay with you for 24 hours following the procedure.  For the next 24 hours, DO NOT: -Drive a car -Advertising copywriter -Drink alcoholic beverages -Take any medication unless instructed by your physician -Make any legal decisions or sign important papers.  Meals: Start with liquid foods such as gelatin or soup. Progress to regular foods as tolerated. Avoid greasy, spicy, heavy foods. If nausea and/or vomiting occur, drink only clear liquids until the nausea and/or vomiting subsides. Call your physician if vomiting continues.  Special Instructions/Symptoms: Your throat may feel dry or sore from the anesthesia or the breathing tube placed in your throat during surgery. If this causes discomfort, gargle with warm salt water. The discomfort should disappear within 24 hours.  If you had a scopolamine patch placed behind your ear for the management of post- operative nausea and/or vomiting:  1. The medication in the patch is effective for 72 hours, after which it should be removed.  Wrap patch in a tissue and discard in the trash. Wash hands thoroughly with soap and water. 2. You may remove the patch earlier than 72 hours if you experience unpleasant side effects which may include dry mouth, dizziness or visual disturbances. 3. Avoid touching the patch. Wash your hands with soap and water after contact with the patch.  No tylenol until after 1:45pm today if needed. Had Hydrocodone-Tylenol 5-325 mg today at 10:36 AM. Do not exceed 4g total of Tylenol in 24 HR period.

## 2023-03-07 NOTE — Anesthesia Postprocedure Evaluation (Signed)
Anesthesia Post Note  Patient: Sherri Mullins  Procedure(s) Performed: LEFT BREAST RADIOACTIVE SEED LOCALIZED EXCISIONAL BIOPSY (Left: Breast)     Patient location during evaluation: PACU Anesthesia Type: General Level of consciousness: awake and alert and oriented Pain management: pain level controlled Vital Signs Assessment: post-procedure vital signs reviewed and stable Respiratory status: spontaneous breathing, nonlabored ventilation and respiratory function stable Cardiovascular status: blood pressure returned to baseline and stable Postop Assessment: no apparent nausea or vomiting Anesthetic complications: no   No notable events documented.  Last Vitals:  Vitals:   03/07/23 0945 03/07/23 0958  BP: 118/80 (!) 142/80  Pulse: 69 66  Resp: 12 16  Temp:  (!) 36.1 C  SpO2: 100% 99%    Last Pain:  Vitals:   03/07/23 0958  TempSrc: Temporal  PainSc: 0-No pain                 [name] A.

## 2023-03-07 NOTE — Anesthesia Procedure Notes (Signed)
Procedure Name: LMA Insertion Date/Time: 03/07/2023 8:37 AM  Performed by: Thornell Mule, CRNAPre-anesthesia Checklist: Patient identified, Emergency Drugs available, Suction available and Patient being monitored Patient Re-evaluated:Patient Re-evaluated prior to induction Oxygen Delivery Method: Circle system utilized Preoxygenation: Pre-oxygenation with 100% oxygen Induction Type: IV induction LMA: LMA inserted LMA Size: 4.0 Number of attempts: 1 Placement Confirmation: positive ETCO2 Tube secured with: Tape Dental Injury: Teeth and Oropharynx as per pre-operative assessment

## 2023-03-07 NOTE — Op Note (Signed)
Pre-op Diagnosis:  Left breast atypical lobular hyperplasia Post-op Diagnosis: same Procedure:  Left breast radioactive seed localized excisional biopsy Surgeon:  Kruz Chiu K. Assistant:  Saunders Glance, PA-C - assistance required for exposure and retraction Anesthesia:  GEN - LMA Indications:  This is a 58 year old female who presents with recent mammograms revealing asymmetry of the left breast. She underwent biopsy of this area that revealed atypical lobular hyperplasia. 1 year prior, the patient had 2 other biopsies of the left breast and other areas that were benign. The patient does not have a family history of breast cancer in first-degree relatives but does have a maternal grandmother who passed away from breast cancer. She also has 2 maternal aunts who have been diagnosed with breast cancer.  Description of procedure: The patient is brought to the operating room placed in supine position on the operating room table. After an adequate level of general anesthesia was obtained, her left breast was prepped with ChloraPrep and draped in sterile fashion. A timeout was taken to ensure the proper patient and proper procedure. We interrogated the breast with the neoprobe. We made a circumareolar incision around the upper side of the nipple after infiltrating with 0.25% Marcaine. Dissection was carried down in the breast tissue with cautery. We used the neoprobe to guide [place] towards the radioactive seed. We excised an area of tissue around the radioactive seed 2 cm in diameter. The specimen was removed and was oriented with a paint kit. Specimen mammogram showed the radioactive seed as well as the biopsy clip within the specimen. This was sent for pathologic examination. There is no residual radioactivity within the biopsy cavity. We inspected carefully for hemostasis.  The wound was closed with a deep layer of 3-0 Vicryl and a subcuticular layer of 4-0 Monocryl. Benzoin Steri-Strips were applied. The patient  was then extubated and brought to the recovery room in stable condition. All sponge, instrument, and needle counts are correct.  Wilmon Arms. Corliss Skains, MD, [facility]  General/ Trauma Surgery  03/07/2023 9:12 AM

## 2023-03-07 NOTE — Transfer of Care (Signed)
Immediate Anesthesia Transfer of Care Note  Patient: Sherri Mullins  Procedure(s) Performed: LEFT BREAST RADIOACTIVE SEED LOCALIZED EXCISIONAL BIOPSY (Left: Breast)  Patient Location: PACU  Anesthesia Type:General  Level of Consciousness: drowsy, patient cooperative, and responds to stimulation  Airway & Oxygen Therapy: Patient Spontanous Breathing and Patient connected to face mask oxygen  Post-op Assessment: Report given to RN and Post -op Vital signs reviewed and stable  Post vital signs: Reviewed and stable  Last Vitals:  Vitals Value Taken Time  BP    Temp    Pulse    Resp    SpO2      Last Pain:  Vitals:   03/07/23 0738  TempSrc: Temporal  PainSc: 0-No pain      Patients Stated Pain Goal: 3 (03/07/23 0738)  Complications: No notable events documented.

## 2023-03-08 ENCOUNTER — Encounter (HOSPITAL_BASED_OUTPATIENT_CLINIC_OR_DEPARTMENT_OTHER): Payer: Self-pay | Admitting: Surgery

## 2023-03-08 LAB — SURGICAL PATHOLOGY

## 2023-06-13 ENCOUNTER — Other Ambulatory Visit: Payer: Self-pay | Admitting: Otolaryngology

## 2023-06-13 DIAGNOSIS — R1314 Dysphagia, pharyngoesophageal phase: Secondary | ICD-10-CM

## 2023-06-13 DIAGNOSIS — K219 Gastro-esophageal reflux disease without esophagitis: Secondary | ICD-10-CM

## 2023-06-21 ENCOUNTER — Ambulatory Visit
Admission: RE | Admit: 2023-06-21 | Discharge: 2023-06-21 | Disposition: A | Discharge status: Home / Self Care | Source: Ambulatory Visit | Attending: Otolaryngology | Admitting: Otolaryngology

## 2023-06-21 DIAGNOSIS — K219 Gastro-esophageal reflux disease without esophagitis: Secondary | ICD-10-CM

## 2023-06-21 DIAGNOSIS — R1314 Dysphagia, pharyngoesophageal phase: Secondary | ICD-10-CM

## 2023-10-01 ENCOUNTER — Emergency Department (HOSPITAL_BASED_OUTPATIENT_CLINIC_OR_DEPARTMENT_OTHER)
Admission: EM | Admit: 2023-10-01 | Discharge: 2023-10-01 | Disposition: A | Discharge status: Home / Self Care | Source: Ambulatory Visit

## 2023-10-01 ENCOUNTER — Emergency Department (HOSPITAL_BASED_OUTPATIENT_CLINIC_OR_DEPARTMENT_OTHER)

## 2023-10-01 ENCOUNTER — Encounter (HOSPITAL_BASED_OUTPATIENT_CLINIC_OR_DEPARTMENT_OTHER): Payer: Self-pay

## 2023-10-01 ENCOUNTER — Other Ambulatory Visit: Payer: Self-pay

## 2023-10-01 DIAGNOSIS — R42 Dizziness and giddiness: Secondary | ICD-10-CM | POA: Diagnosis present

## 2023-10-01 DIAGNOSIS — Z79899 Other long term (current) drug therapy: Secondary | ICD-10-CM | POA: Insufficient documentation

## 2023-10-01 DIAGNOSIS — R911 Solitary pulmonary nodule: Secondary | ICD-10-CM | POA: Diagnosis not present

## 2023-10-01 DIAGNOSIS — E039 Hypothyroidism, unspecified: Secondary | ICD-10-CM | POA: Diagnosis not present

## 2023-10-01 DIAGNOSIS — J019 Acute sinusitis, unspecified: Secondary | ICD-10-CM | POA: Diagnosis not present

## 2023-10-01 LAB — CBC
HCT: 43.8 % (ref 36.0–46.0)
Hemoglobin: 14.7 g/dL (ref 12.0–15.0)
MCH: 30.1 pg (ref 26.0–34.0)
MCHC: 33.6 g/dL (ref 30.0–36.0)
MCV: 89.6 fL (ref 80.0–100.0)
Platelets: 340 K/uL (ref 150–400)
RBC: 4.89 MIL/uL (ref 3.87–5.11)
RDW: 12.2 % (ref 11.5–15.5)
WBC: 7.4 K/uL (ref 4.0–10.5)
nRBC: 0 % (ref 0.0–0.2)

## 2023-10-01 LAB — D-DIMER, QUANTITATIVE: D-Dimer, Quant: <0.27 ug{FEU}/mL (ref 0.00–0.50)

## 2023-10-01 LAB — COMPREHENSIVE METABOLIC PANEL WITH GFR
ALT: 37 U/L (ref 0–44)
AST: 25 U/L (ref 15–41)
Albumin: 4.3 g/dL (ref 3.5–5.0)
Alkaline Phosphatase: 72 U/L (ref 38–126)
Anion gap: 10 (ref 5–15)
BUN: 8 mg/dL (ref 6–20)
CO2: 19 mmol/L — ABNORMAL LOW (ref 22–32)
Calcium: 9.7 mg/dL (ref 8.9–10.3)
Chloride: 110 mmol/L (ref 98–111)
Creatinine, Ser: 0.72 mg/dL (ref 0.44–1.00)
GFR, Estimated: >60 mL/min (ref >60)
Glucose, Bld: 113 mg/dL — ABNORMAL HIGH (ref 70–99)
Potassium: 4 mmol/L (ref 3.5–5.1)
Sodium: 139 mmol/L (ref 135–145)
Total Bilirubin: 0.5 mg/dL (ref 0.0–1.2)
Total Protein: 6.8 g/dL (ref 6.5–8.1)

## 2023-10-01 LAB — TROPONIN T, HIGH SENSITIVITY: Troponin T High Sensitivity: <15 ng/L (ref 0–19)

## 2023-10-01 LAB — URINALYSIS, ROUTINE W REFLEX MICROSCOPIC
Bilirubin Urine: NEGATIVE
Glucose, UA: NEGATIVE mg/dL
Hgb urine dipstick: NEGATIVE
Ketones, ur: NEGATIVE mg/dL
Leukocytes,Ua: NEGATIVE
Nitrite: NEGATIVE
Protein, ur: NEGATIVE mg/dL
Specific Gravity, Urine: 1.024 (ref 1.005–1.030)
pH: 5.5 (ref 5.0–8.0)

## 2023-10-01 LAB — CBG MONITORING, ED: Glucose-Capillary: 127 mg/dL — ABNORMAL HIGH (ref 70–99)

## 2023-10-01 MED ORDER — IOHEXOL 350 MG/ML SOLN
100.0000 mL | Freq: Once | INTRAVENOUS | Status: AC | PRN
  Administered 2023-10-01: 75 mL via INTRAVENOUS

## 2023-10-01 MED ORDER — AMOXICILLIN-POT CLAVULANATE 875-125 MG PO TABS: 1.0000 | ORAL_TABLET | Freq: Two times a day (BID) | ORAL | 0 refills | Status: AC

## 2023-10-01 MED ORDER — FLUTICASONE PROPIONATE 50 MCG/ACT NA SUSP: 1.0000 | Freq: Every day | NASAL | 2 refills | Status: AC

## 2023-10-01 NOTE — Discharge Instructions (Addendum)
 As we discussed your workup today was reassuring, no evidence of stroke, blood clot, there was a fair amount of sinus inflammation and fluid in the sinuses, this can contribute to the symptoms that you are describing, I recommend treating with antibiotics, Flonase , and following closely with the ear nose and throat doctor if symptoms do not improve despite treatment.  Additionally they noted a 6 mm nonspecific pulmonary nodule in your left upper lung.  This is a nonspecific finding, and official radiology recommendation is for a repeat CT to monitor the size of the pulmonary nodule in 6 to 12 months.  I recommend establishing care with a primary care doctor and having them schedule this follow-up for you.  Please return to the emergency department if you have severe worsening dizziness, headache, new numbness, tingling, weakness despite treatment for sinus infection as discussed above.  It was a pleasure taking care of you today.

## 2023-10-01 NOTE — ED Provider Notes (Signed)
 [place] [facility] DEPARTMENT AT [facility] Provider Note   CSN: [id number] Arrival date & time: 10/01/23  1228     Patient presents with: Dizziness   Sherri Mullins is a 58 y.o. female with past medical history significant for hyperlipidemia, previous TIA, hypothyroidism secondary to Hashimoto's, MTHFR deficiency and homocystinuria, PCOS, arthritis who presents concern for multiple complaints today.  Patient reports that she has been having some intermittent positional dizziness, she had 1 episode of what felt like significant vertigo last week that started while sleeping, seemed positional in nature, was seen at urgent care, had unremarkable ear exam, they felt symptoms were consistent with probable BPPV, discharged with meclizine which she does feel like is helping some but still having some generalized fuzzy headed sensation, tiredness, shortness of breath.  Denies any leg swelling, chest pain.,  No previous history of blood clot.    Dizziness      Prior to Admission medications   Medication Sig Start Date End Date Taking? Authorizing Provider  amoxicillin -clavulanate (AUGMENTIN ) 875-125 MG tablet Take 1 tablet by mouth every 12 (twelve) hours. 10/01/23  Yes Areonna Bran H, PA-C  fluticasone  (FLONASE ) 50 MCG/ACT nasal spray Place 1 spray into both nostrils daily. 10/01/23  Yes Syeda Prickett H, PA-C  ALPRAZolam (XANAX) 0.25 MG tablet alprazolam 0.25 mg tablet    [provider]  ARMOUR THYROID 120 MG tablet Take 1 tablet by mouth daily. 12/04/16   [provider]  estradiol (ESTRACE) 1 MG tablet Take 1 mg by mouth daily. 01/20/20   [provider]  levothyroxine (SYNTHROID) 75 MCG tablet Take 75 mcg by mouth daily before breakfast.    [provider]  naltrexone (DEPADE) 50 MG tablet Take 50 mg by mouth daily.    [provider]  progesterone (PROMETRIUM) 100 MG capsule Take by mouth daily. As directed    [provider]    Allergies: Egg-derived products, Nitrous oxide, Percocet [oxycodone-acetaminophen ], and Percodan [oxycodone-aspirin]    Review of Systems  Neurological:  Positive for dizziness.  All other systems reviewed and are negative.   Updated Vital Signs BP (!) 144/73 (BP Location: Left Arm)   Pulse 64   Temp 98.1 F (36.7 C) (Oral)   Resp 14   LMP 11/22/2016 (Approximate)   SpO2 99%   Physical Exam Vitals and nursing note reviewed.  Constitutional:      General: She is not in acute distress.    Appearance: Normal appearance.  HENT:     Head: Normocephalic and atraumatic.     Ears:     Comments: Normal appearance of TMs bilaterally.  No effusion, no bulging membrane. Eyes:     General:        Right eye: No discharge.        Left eye: No discharge.  Cardiovascular:     Rate and Rhythm: Normal rate and regular rhythm.     Heart sounds: No murmur heard.    No friction rub. No gallop.  Pulmonary:     Effort: Pulmonary effort is normal.     Breath sounds: Normal breath sounds.  Abdominal:     General: Bowel sounds are normal.     Palpations: Abdomen is soft.  Skin:    General: Skin is warm and dry.     Capillary Refill: Capillary refill takes less than 2 seconds.  Neurological:     Mental Status: She is alert and oriented to person, place, and time.     Comments:  Cranial nerves II through XII grossly intact.  Some hesitancy with finger-nose-finger, intact heel-to-shin.  Romberg negative, gait normal.  Alert and oriented x3.  Moves all 4 limbs spontaneously, normal coordination.  No pronator drift.  Intact strength 5 out of 5 bilateral upper and lower extremities.    Psychiatric:        Mood and Affect: Mood normal.        Behavior: Behavior normal.     (all labs ordered are listed, but only abnormal results are displayed) Labs Reviewed  COMPREHENSIVE METABOLIC PANEL WITH GFR - Abnormal; Notable for the following components:      Result Value   CO2 19  (*)    Glucose, Bld 113 (*)    All other components within normal limits  CBG MONITORING, ED - Abnormal; Notable for the following components:   Glucose-Capillary 127 (*)    All other components within normal limits  CBC  D-DIMER, QUANTITATIVE  URINALYSIS, ROUTINE W REFLEX MICROSCOPIC  TROPONIN T, HIGH SENSITIVITY    EKG: EKG Interpretation Date/Time:  Monday October 01 2023 12:47:39 EDT Ventricular Rate:  76 PR Interval:  180 QRS Duration:  78 QT Interval:  388 QTC Calculation: 436 R Axis:   10  Text Interpretation: Normal sinus rhythm Cannot rule out Inferior infarct , age undetermined Anterior infarct , age undetermined Abnormal ECG When compared with ECG of 09-Jul-2020 16:24, PREVIOUS ECG IS PRESENT Confirmed by Jerrol Agent ([id number]) on 10/01/2023 4:43:58 PM  Radiology: CT ANGIO HEAD NECK W WO CM Result Date: 10/01/2023 CLINICAL DATA:  Provided history: Neuro deficit, acute, stroke suspected. EXAM: CT ANGIOGRAPHY HEAD AND NECK WITH AND WITHOUT CONTRAST TECHNIQUE: Multidetector CT imaging of the head and neck was performed using the standard protocol during bolus administration of intravenous contrast. Multiplanar CT image reconstructions and MIPs were obtained to evaluate the vascular anatomy. Carotid stenosis measurements (when applicable) are obtained utilizing NASCET criteria, using the distal internal carotid diameter as the denominator. RADIATION DOSE REDUCTION: This exam was performed according to the departmental dose-optimization program which includes automated exposure control, adjustment of the mA and/or kV according to patient size and/or use of iterative reconstruction technique. CONTRAST:  75mL OMNIPAQUE  IOHEXOL  350 MG/ML SOLN COMPARISON:  Head CT 07/09/2020. FINDINGS: CT HEAD FINDINGS Brain: Cerebral volume is normal. There is no acute intracranial hemorrhage. No demarcated cortical infarct. No extra-axial fluid collection. No evidence of an intracranial mass. No midline  shift. Vascular: No hyperdense vessel. Skull: No calvarial fracture or aggressive osseous lesion. Sinuses/Orbits: No orbital mass or acute orbital finding. Moderate mucosal thickening within the right maxillary sinus. Moderate-sized fluid level, and mild background mucosal thickening, within the left maxillary sinus. Moderate bilateral ethmoid sinusitis. Review of the MIP images confirms the above findings CTA NECK FINDINGS Aortic arch: The aortic arch is excluded from the field of view. The origins of the innominate artery, left common carotid artery, left vertebral artery and left subclavian artery are also excluded from the field of view. No hemodynamically significant stenosis within the innominate or proximal subclavian arteries at the visible levels. Right carotid system: CCA and ICA patent within the neck without stenosis or significant atherosclerotic disease. Left carotid system: The common carotid artery origin is excluded from the field of view. Within this limitation, the common carotid and internal carotid arteries are patent within the neck without stenosis or significant atherosclerotic disease. Vertebral arteries: The left vertebral artery origin is excluded from the field of view. Within this limitation, the vertebral arteries are  codominant and patent within the neck without stenosis or significant atherosclerotic disease. Skeleton: No acute fracture or aggressive osseous lesion. Other neck: No neck mass or cervical lymphadenopathy. Upper chest: 6 mm ground-glass pulmonary nodule within the left upper lobe (series 9, image 312). Review of the MIP images confirms the above findings CTA HEAD FINDINGS Anterior circulation: The intracranial internal carotid arteries are patent. The M1 middle cerebral arteries are patent. No M2 proximal branch occlusion or high-grade proximal stenosis. The anterior cerebral arteries are patent. No intracranial aneurysm is identified. Posterior circulation: The  intracranial vertebral arteries are patent. The basilar artery is patent. The posterior cerebral arteries are patent. Hypoplastic P1 segments with sizable posterior communicating arteries, bilaterally. Venous sinuses: Within the limitations of contrast timing, no convincing thrombus. Anatomic variants: As described. Review of the MIP images confirms the above findings IMPRESSION: CT head: 1.  No evidence of an acute intracranial abnormality. 2. Paranasal sinus disease as described. Correlate for signs/symptoms of acute sinusitis. CTA neck: 1. The origins of the innominate artery, left common carotid artery, left vertebral artery and left subclavian artery are excluded from the field of view. Within this limitation, findings are as follows. 2. The common carotid, internal carotid and vertebral arteries are patent within the neck without stenosis or significant atherosclerotic disease. 3. 6 mm ground-glass pulmonary nodule within the left upper lobe. An initial follow-up CT is recommended in 6-12 months. If the nodule is persistent and stable on initial follow-up, subsequent follow-up chest CT examinations are recommended every two years until a total of five years of stability is established This recommendation follows the consensus statement: Guidelines for Management of Incidental Pulmonary Nodules Detected on CT Images: From the Fleischner Society 2017; Radiology 2017; [phone]. CT head: No proximal intracranial large vessel occlusion or high-grade proximal arterial stenosis identified. Electronically Signed   By: Rockey Childs D.O.   On: 10/01/2023 16:37   DG Chest Portable 1 View Result Date: 10/01/2023 CLINICAL DATA:  Shortness of breath EXAM: PORTABLE CHEST 1 VIEW COMPARISON:  None Available. FINDINGS: The heart size and mediastinal contours are within normal limits. Pulmonary vascular prominence without overt edema. The visualized skeletal structures are unremarkable. IMPRESSION: Pulmonary vascular  prominence without overt edema in AP portable projection. Electronically Signed   By: Marolyn JONETTA Jaksch M.D.   On: 10/01/2023 15:22     Procedures   Medications Ordered in the ED  iohexol  (OMNIPAQUE ) 350 MG/ML injection 100 mL (75 mLs Intravenous Contrast Given 10/01/23 1558)                                    Medical Decision Making Amount and/or Complexity of Data Reviewed Labs: ordered. Radiology: ordered.  Risk Prescription drug management.   This patient is a 58 y.o. female  who presents to the ED for concern of dizziness, shortness of breath.   Differential diagnoses prior to evaluation: The emergent differential diagnosis includes, but is not limited to,  BPPV, vestibular migraine, head trauma, AVM, intracranial tumor, multiple sclerosis, drug-related, CVA, orthostatic hypotension, sepsis, hypoglycemia, electrolyte disturbance, anemia, anxiety, asthma exacerbation, COPD exacerbation, acute upper respiratory infection, acute bronchitis, chronic bronchitis, interstitial lung disease, ARDS, PE, pneumonia, atypical ACS, carbon monoxide poisoning, spontaneous pneumothorax, new CHF vs CHF exacerbation, versus other . This is not an exhaustive differential.   Past Medical History / Co-morbidities / Social History: hyperlipidemia, previous TIA, hypothyroidism secondary to Hashimoto's, MTHFR deficiency and homocystinuria,  PCOS, arthritis  Additional history: Chart reviewed. Pertinent results include: Reviewed recent previous urgent care evaluation for similar  Physical Exam: Physical exam performed. The pertinent findings include: Cranial nerves II through XII grossly intact.  Some hesitancy with finger-nose-finger, intact heel-to-shin.  Romberg negative, gait normal.  Alert and oriented x3.  Moves all 4 limbs spontaneously, normal coordination.  No pronator drift.  Intact strength 5 out of 5 bilateral upper and lower extremities.  Tympanic membrane is normal-appearing  bilaterally.  Lab Tests/Imaging studies: I personally interpreted labs/imaging and the pertinent results include: CMP with mild bicarb deficit, CO2 19, otherwise overall unremarkable, CBC unremarkable, one-time troponin is negative in context of no active chest pain, nonspecific shortness of breath.  UA unremarkable, D-dimer negative.  CT angio head and neck with and without contrast shows no evidence of acute stroke, or other acute intracranial abnormality other than fairly significant sinus disease throughout.  Incidental 6 mm left upper pulmonary nodule noted.  I do think that her sinus disease could be contributing to her dizziness, eustachian tube dysfunction.. I agree with the radiologist interpretation.  Cardiac monitoring: EKG obtained and interpreted by myself and attending physician which shows: Normal sinus rhythm, nonspecific ST-T changes with no significant change from baseline.   Medications: Will plan to treat with Augmentin , Flonase , encouraged ENT follow-up.   Disposition: After consideration of the diagnostic results and the patients response to treatment, I feel that patient with normal neurologic exam, improvement of symptoms on reassessment, stable for discharge at this time with close ENT follow-up, treatment for sinusitis as discussed above.   emergency department workup does not suggest an emergent condition requiring admission or immediate intervention beyond what has been performed at this time. The plan is: as above. The patient is safe for discharge and has been instructed to return immediately for worsening symptoms, change in symptoms or any other concerns.   Final diagnoses:  Dizziness  Subacute sinusitis, unspecified location  Pulmonary nodule    ED Discharge Orders          Ordered    amoxicillin -clavulanate (AUGMENTIN ) 875-125 MG tablet  Every 12 hours        10/01/23 1710    fluticasone  (FLONASE ) 50 MCG/ACT nasal spray  Daily        10/01/23 1710                Avarie Tavano, Comeri­o H, PA-C 10/01/23 1716    Simon Lavonia SAILOR, MD 10/02/23 1022

## 2023-10-01 NOTE — ED Triage Notes (Signed)
 Pt c/o weird symptoms onset last week, woke up from a dead sleep feeling like I was falling; I was really dizzy- went to UC for same thinking inner ear infection. States that last night she couldn't sleep, woke up again, just feel odd- fuzzy in the head  Follow up appt Wednesday
